# Patient Record
Sex: Male | Born: 1960 | Race: Black or African American | Hispanic: No | Marital: Single | Smoking: Never smoker
Health system: Southern US, Community
[De-identification: ages and names within clinical notes are randomized; demographics above are authoritative.]

## PROBLEM LIST (undated history)

## (undated) DIAGNOSIS — F1010 Alcohol abuse, uncomplicated: Secondary | ICD-10-CM

## (undated) DIAGNOSIS — F32A Depression, unspecified: Secondary | ICD-10-CM

## (undated) DIAGNOSIS — E119 Type 2 diabetes mellitus without complications: Secondary | ICD-10-CM

## (undated) DIAGNOSIS — J45909 Unspecified asthma, uncomplicated: Secondary | ICD-10-CM

## (undated) DIAGNOSIS — F329 Major depressive disorder, single episode, unspecified: Secondary | ICD-10-CM

## (undated) HISTORY — PX: HAND SURGERY: SHX662

## (undated) HISTORY — PX: KNEE SURGERY: SHX244

---

## 1997-09-07 ENCOUNTER — Emergency Department (HOSPITAL_COMMUNITY): Admission: EM | Admit: 1997-09-07 | Discharge: 1997-09-07 | Payer: Self-pay | Admitting: Emergency Medicine

## 1999-08-23 ENCOUNTER — Emergency Department (HOSPITAL_COMMUNITY): Admission: EM | Admit: 1999-08-23 | Discharge: 1999-08-23 | Payer: Self-pay | Admitting: Emergency Medicine

## 1999-08-23 ENCOUNTER — Encounter: Payer: Self-pay | Admitting: Emergency Medicine

## 1999-08-31 ENCOUNTER — Ambulatory Visit (HOSPITAL_BASED_OUTPATIENT_CLINIC_OR_DEPARTMENT_OTHER): Admission: RE | Admit: 1999-08-31 | Discharge: 1999-09-01 | Payer: Self-pay | Admitting: Orthopedic Surgery

## 2001-01-18 ENCOUNTER — Emergency Department (HOSPITAL_COMMUNITY): Admission: EM | Admit: 2001-01-18 | Discharge: 2001-01-18 | Payer: Self-pay | Admitting: Emergency Medicine

## 2002-02-23 ENCOUNTER — Emergency Department (HOSPITAL_COMMUNITY): Admission: EM | Admit: 2002-02-23 | Discharge: 2002-02-23 | Payer: Self-pay | Admitting: Emergency Medicine

## 2012-05-12 ENCOUNTER — Encounter (HOSPITAL_COMMUNITY): Payer: Self-pay

## 2012-05-12 ENCOUNTER — Emergency Department (HOSPITAL_COMMUNITY)
Admission: EM | Admit: 2012-05-12 | Discharge: 2012-05-12 | Disposition: A | Discharge status: Home / Self Care | Payer: Self-pay | Attending: Emergency Medicine | Admitting: Emergency Medicine

## 2012-05-12 DIAGNOSIS — T148XXA Other injury of unspecified body region, initial encounter: Secondary | ICD-10-CM

## 2012-05-12 DIAGNOSIS — IMO0002 Reserved for concepts with insufficient information to code with codable children: Secondary | ICD-10-CM | POA: Insufficient documentation

## 2012-05-12 DIAGNOSIS — Z23 Encounter for immunization: Secondary | ICD-10-CM | POA: Insufficient documentation

## 2012-05-12 DIAGNOSIS — Z9889 Other specified postprocedural states: Secondary | ICD-10-CM | POA: Insufficient documentation

## 2012-05-12 DIAGNOSIS — S51809A Unspecified open wound of unspecified forearm, initial encounter: Secondary | ICD-10-CM | POA: Insufficient documentation

## 2012-05-12 DIAGNOSIS — S41109A Unspecified open wound of unspecified upper arm, initial encounter: Secondary | ICD-10-CM | POA: Insufficient documentation

## 2012-05-12 MED ORDER — LIDOCAINE-EPINEPHRINE 2 %-1:100000 IJ SOLN: 20.0000 mL | Freq: Once | INTRAMUSCULAR | Status: DC

## 2012-05-12 MED ORDER — TETANUS-DIPHTH-ACELL PERTUSSIS 5-2.5-18.5 LF-MCG/0.5 IM SUSP
0.5000 mL | Freq: Once | INTRAMUSCULAR | Status: AC
  Administered 2012-05-12: 0.5 mL via INTRAMUSCULAR
  Filled 2012-05-12: qty 0.5
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
## 2012-05-12 NOTE — ED Provider Notes (Signed)
Medical screening examination/treatment/procedure(s) were performed by non-physician practitioner and as supervising physician I was immediately available for consultation/collaboration.  John-Adam Nolberto Cheuvront, M.D.  John-Adam Bryar Rennie, MD 05/12/12 1146 
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
## 2012-05-28 ENCOUNTER — Emergency Department (HOSPITAL_BASED_OUTPATIENT_CLINIC_OR_DEPARTMENT_OTHER): Payer: Medicaid Other

## 2012-05-28 ENCOUNTER — Emergency Department (HOSPITAL_COMMUNITY): Payer: Medicaid Other

## 2012-05-28 ENCOUNTER — Inpatient Hospital Stay (HOSPITAL_BASED_OUTPATIENT_CLINIC_OR_DEPARTMENT_OTHER)
Admission: EM | Admit: 2012-05-28 | Discharge: 2012-05-30 | DRG: 087 | Disposition: A | Discharge status: Home / Self Care | Payer: Medicaid Other | Attending: General Surgery | Admitting: General Surgery

## 2012-05-28 ENCOUNTER — Encounter (HOSPITAL_BASED_OUTPATIENT_CLINIC_OR_DEPARTMENT_OTHER): Payer: Self-pay | Admitting: *Deleted

## 2012-05-28 DIAGNOSIS — S0003XA Contusion of scalp, initial encounter: Secondary | ICD-10-CM | POA: Diagnosis present

## 2012-05-28 DIAGNOSIS — R5381 Other malaise: Secondary | ICD-10-CM | POA: Diagnosis present

## 2012-05-28 DIAGNOSIS — S161XXA Strain of muscle, fascia and tendon at neck level, initial encounter: Secondary | ICD-10-CM

## 2012-05-28 DIAGNOSIS — S06369A Traumatic hemorrhage of cerebrum, unspecified, with loss of consciousness of unspecified duration, initial encounter: Secondary | ICD-10-CM

## 2012-05-28 DIAGNOSIS — S20212A Contusion of left front wall of thorax, initial encounter: Secondary | ICD-10-CM

## 2012-05-28 DIAGNOSIS — S0181XA Laceration without foreign body of other part of head, initial encounter: Secondary | ICD-10-CM

## 2012-05-28 DIAGNOSIS — S0633AA Contusion and laceration of cerebrum, unspecified, with loss of consciousness status unknown, initial encounter: Secondary | ICD-10-CM

## 2012-05-28 DIAGNOSIS — S1093XA Contusion of unspecified part of neck, initial encounter: Secondary | ICD-10-CM | POA: Diagnosis present

## 2012-05-28 DIAGNOSIS — S065XAA Traumatic subdural hemorrhage with loss of consciousness status unknown, initial encounter: Secondary | ICD-10-CM

## 2012-05-28 DIAGNOSIS — S80819A Abrasion, unspecified lower leg, initial encounter: Secondary | ICD-10-CM

## 2012-05-28 DIAGNOSIS — IMO0002 Reserved for concepts with insufficient information to code with codable children: Secondary | ICD-10-CM | POA: Diagnosis present

## 2012-05-28 DIAGNOSIS — S065X9A Traumatic subdural hemorrhage with loss of consciousness of unspecified duration, initial encounter: Secondary | ICD-10-CM

## 2012-05-28 DIAGNOSIS — S0083XA Contusion of other part of head, initial encounter: Secondary | ICD-10-CM

## 2012-05-28 DIAGNOSIS — S01112A Laceration without foreign body of left eyelid and periocular area, initial encounter: Secondary | ICD-10-CM

## 2012-05-28 DIAGNOSIS — S065X0A Traumatic subdural hemorrhage without loss of consciousness, initial encounter: Principal | ICD-10-CM | POA: Diagnosis present

## 2012-05-28 DIAGNOSIS — S0180XA Unspecified open wound of other part of head, initial encounter: Secondary | ICD-10-CM | POA: Diagnosis present

## 2012-05-28 DIAGNOSIS — R413 Other amnesia: Secondary | ICD-10-CM | POA: Diagnosis present

## 2012-05-28 DIAGNOSIS — I619 Nontraumatic intracerebral hemorrhage, unspecified: Secondary | ICD-10-CM

## 2012-05-28 DIAGNOSIS — S139XXA Sprain of joints and ligaments of unspecified parts of neck, initial encounter: Secondary | ICD-10-CM

## 2012-05-28 LAB — CBC WITH DIFFERENTIAL/PLATELET
Basophils Absolute: 0 10*3/uL (ref 0.0–0.1)
Basophils Relative: 0 % (ref 0–1)
Eosinophils Absolute: 0 10*3/uL (ref 0.0–0.7)
Eosinophils Relative: 0 % (ref 0–5)
MCH: 32.2 pg (ref 26.0–34.0)
MCHC: 35.3 g/dL (ref 30.0–36.0)
MCV: 91.4 fL (ref 78.0–100.0)
Platelets: 273 10*3/uL (ref 150–400)
RDW: 12.5 % (ref 11.5–15.5)
WBC: 15.1 10*3/uL — ABNORMAL HIGH (ref 4.0–10.5)

## 2012-05-28 LAB — COMPREHENSIVE METABOLIC PANEL
ALT: 38 U/L (ref 0–53)
AST: 89 U/L — ABNORMAL HIGH (ref 0–37)
Calcium: 9.3 mg/dL (ref 8.4–10.5)
Creatinine, Ser: 1.1 mg/dL (ref 0.50–1.35)
Sodium: 144 mEq/L (ref 135–145)
Total Protein: 7.6 g/dL (ref 6.0–8.3)

## 2012-05-28 MED ORDER — MORPHINE SULFATE 2 MG/ML IJ SOLN
2.0000 mg | INTRAMUSCULAR | Status: DC | PRN
  Administered 2012-05-28 – 2012-05-29 (×3): 2 mg via INTRAVENOUS
  Filled 2012-05-28 (×3): qty 1

## 2012-05-28 MED ORDER — PANTOPRAZOLE SODIUM 40 MG PO TBEC
40.0000 mg | DELAYED_RELEASE_TABLET | Freq: Every day | ORAL | Status: DC
  Administered 2012-05-28 – 2012-05-30 (×3): 40 mg via ORAL
  Filled 2012-05-28 (×3): qty 1

## 2012-05-28 MED ORDER — ONDANSETRON HCL 4 MG PO TABS: 4.0000 mg | ORAL_TABLET | Freq: Four times a day (QID) | ORAL | Status: DC | PRN

## 2012-05-28 MED ORDER — DOCUSATE SODIUM 100 MG PO CAPS
100.0000 mg | ORAL_CAPSULE | Freq: Two times a day (BID) | ORAL | Status: DC
  Administered 2012-05-28 – 2012-05-30 (×5): 100 mg via ORAL
  Filled 2012-05-28 (×6): qty 1

## 2012-05-28 MED ORDER — ACETAMINOPHEN 325 MG PO TABS: 650.0000 mg | ORAL_TABLET | ORAL | Status: DC | PRN

## 2012-05-28 MED ORDER — POTASSIUM CHLORIDE IN NACL 20-0.9 MEQ/L-% IV SOLN
INTRAVENOUS | Status: DC
  Administered 2012-05-28 (×2): via INTRAVENOUS
  Filled 2012-05-28 (×4): qty 1000

## 2012-05-28 MED ORDER — PANTOPRAZOLE SODIUM 40 MG IV SOLR
40.0000 mg | Freq: Every day | INTRAVENOUS | Status: DC
  Filled 2012-05-28: qty 40

## 2012-05-28 MED ORDER — OXYCODONE HCL 5 MG PO TABS
5.0000 mg | ORAL_TABLET | ORAL | Status: DC | PRN
  Administered 2012-05-28 – 2012-05-29 (×3): 5 mg via ORAL
  Filled 2012-05-28 (×3): qty 1

## 2012-05-28 MED ORDER — OXYCODONE HCL 5 MG PO TABS
10.0000 mg | ORAL_TABLET | ORAL | Status: DC | PRN
  Administered 2012-05-28 – 2012-05-30 (×4): 10 mg via ORAL
  Filled 2012-05-28 (×4): qty 2

## 2012-05-28 MED ORDER — LORAZEPAM 2 MG/ML IJ SOLN
INTRAMUSCULAR | Status: AC
  Administered 2012-05-28: 2 mg
  Filled 2012-05-28: qty 1

## 2012-05-28 MED ORDER — LORAZEPAM 2 MG/ML IJ SOLN: 1.0000 mg | Freq: Four times a day (QID) | INTRAMUSCULAR | Status: DC | PRN

## 2012-05-28 MED ORDER — ONDANSETRON HCL 4 MG/2ML IJ SOLN: 4.0000 mg | Freq: Four times a day (QID) | INTRAMUSCULAR | Status: DC | PRN

## 2012-05-28 NOTE — ED Notes (Signed)
Wife at the bedside

## 2012-05-28 NOTE — ED Provider Notes (Signed)
Pt arrived from [facility] He is awake/alert Dr Janee Morn is here to evaluate patient for admission   Joya Gaskins, MD 05/28/12 [phone]

## 2012-05-28 NOTE — Progress Notes (Signed)
Utilization review completed.  P.J. Audelia Knape,RN,BSN Case Manager [phone]  

## 2012-05-28 NOTE — ED Notes (Addendum)
Pt pulled IV out and stating he is going to leave. Dr. Janee Morn contacted and informed. States he will be down here to see the patient as soon as he can but the patient does not need to leave. [employer] called to the bedside. Pt states he doesn't want to stay and has to much going on at work.
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
## 2012-05-28 NOTE — ED Notes (Signed)
Pt agreed to change into paper scrubs and footies. Pt is aware that he is being transferred to [facility] ER and agrees to transport. Pt signed transfer consent and called his wife to tell her where he is being transferred to.

## 2012-05-28 NOTE — ED Notes (Signed)
Pt continues to state that he is not going to go up to his room. Pt continues to try and call out on the phone but unable to reach family member. [employer] and

## 2012-05-28 NOTE — ED Notes (Signed)
Admitting at the bedside.  
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
## 2012-05-28 NOTE — ED Notes (Signed)
Pt returned from CT °

## 2012-05-28 NOTE — ED Notes (Signed)
Pt now agreeing to be sutured and to have radiological studies done.

## 2012-05-28 NOTE — ED Notes (Signed)
Dr. Lerry Liner at bedside attempting to repair eyebrow lac but pt sts that he does not want to be treated and he wants to leave. Pt given telephone and is calling for a ride at this time.

## 2012-05-28 NOTE — ED Notes (Signed)
Pt has odor of ETOH about his person denies drinking tonight.

## 2012-05-28 NOTE — ED Notes (Signed)
I spoke with pt's uncle, Nabil Bubolz, who sts he is on his way to pick up the pt and will be here in apprx. 1 hour.

## 2012-05-28 NOTE — ED Notes (Signed)
[facility] unavailable for pt transport, call placed to [facility] for transport to [facility] per Dr Judd Lien.
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
## 2012-05-28 NOTE — ED Notes (Signed)
MD at bedside. 

## 2012-05-28 NOTE — Progress Notes (Signed)
Patient ID: [name], male   DOB: [birth year], 52 y.o.   MRN: [id number] Called by RN.  Patient took out his IV and wants to leave.  He is agitated.  I explained again his TBI/SDH/intra cerebral hemorrhage and the importance of him staying for NS evaluation and treatment.  He is verbally abusive but seems to agree to at least have his IV replaced and go to 3100.  Dr. Phoebe Perch will see him there.  Will give ativan 1mg  X1  Once IV in. [employer] also at bedside. Violeta Gelinas, MD, MPH, FACS Pager: [phone]
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
## 2012-05-28 NOTE — ED Notes (Signed)
Pt is now questioning where his shoes and phone are. Pt was dropped off by [employer] with no belongings.

## 2012-05-28 NOTE — ED Notes (Signed)
Pt's wife Maxine Glenn called and sts she is on her way to [facility] ER at this time.

## 2012-05-28 NOTE — ED Notes (Signed)
Patient transported to CT 
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
## 2012-05-29 ENCOUNTER — Inpatient Hospital Stay (HOSPITAL_COMMUNITY): Payer: Medicaid Other

## 2012-05-29 ENCOUNTER — Encounter (HOSPITAL_COMMUNITY): Payer: Self-pay | Admitting: Radiology

## 2012-05-29 LAB — BASIC METABOLIC PANEL
BUN: 9 mg/dL (ref 6–23)
Creatinine, Ser: 1.01 mg/dL (ref 0.50–1.35)
GFR calc Af Amer: >90 mL/min (ref >90)
GFR calc non Af Amer: 85 mL/min — ABNORMAL LOW (ref >90)
Glucose, Bld: 107 mg/dL — ABNORMAL HIGH (ref 70–99)

## 2012-05-29 LAB — CBC
HCT: 40.7 % (ref 39.0–52.0)
MCH: 32.1 pg (ref 26.0–34.0)
MCHC: 35.4 g/dL (ref 30.0–36.0)
MCV: 90.6 fL (ref 78.0–100.0)
RDW: 12.7 % (ref 11.5–15.5)
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
## 2012-05-30 MED ORDER — OXYCODONE-ACETAMINOPHEN 5-325 MG PO TABS: 1.0000 | ORAL_TABLET | ORAL | Status: DC | PRN

## 2012-05-30 NOTE — Discharge Summary (Signed)
Victoria Euceda, MD, MPH, FACS Pager: [phone]  

## 2012-05-30 NOTE — Progress Notes (Signed)
Doing well. C/o mild HA  Temp:  [97.9 F (36.6 C)-98.7 F (37.1 C)] 98.1 F (36.7 C) (04/17 0400) Pulse Rate:  [71-92] 82 (04/17 0400) Resp:  [13-26] 18 (04/16 2130) BP: (118-162)/(75-106) 118/75 mmHg (04/17 0400) SpO2:  [98 %-100 %] 98 % (04/17 0400) AAOx3  - FC all 4  - was up ambulating yesterday   Plan: Working with TX's  - CPM Recall if we can be of further assistance
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
## 2012-05-30 NOTE — Clinical Social Work Note (Signed)
Clinical Social Worker provided patient with requested court letter regarding court date of today.  Patient verbalized appreciation and understanding.  Clinical Social Worker will sign off for now as social work intervention is no longer needed. Please consult [place] again if new need arises.  Macario Golds, [place] [phone]
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
## 2012-05-30 NOTE — Progress Notes (Signed)
DC INSTRUCTIONS GIVEN TO PT AT THIS TIME RE: F/U APPTS, S/S OF PROBLEMS TO REPORT TO MD, NEW PRESCRIPTIONS, PAIN CONTROL, MYCHART, AND COMMUNITY RESOURCES.  PT VERBALIZED UNDERSTANDING OF ALL INSTRUCTIONS.  NO S/S OF ANY ACUTE DISTRESS NOTED AT THIS TIME.

## 2012-05-30 NOTE — Progress Notes (Addendum)
Speech Language Pathology Treatment Patient Details Name: [name] MRN: [id number] DOB: 01/26/1961 Today's Date: 05/30/2012 Time: 4696-2952 SLP Time Calculation (min): 40 min  Assessment / Plan / Recommendation Clinical Impression  Treatment focused on facilitation of cognitive recovery and differential diagnosis of current cognitive status. MD questioning if patient safe to return home without supervision. Cognistat provided. Patient presents with mild short term memory deficits and perseveration on recent events, particularly his lack of knowledge of pre-admission events. Patient presented with positive intellectual awareness of deficits today and general orientation to situation. This SLP is unclear at this time, what information patient has been provided with regarding current situation, making diagnosis of short term memory deficits difficult. Based on evaluation results, recommend at least intermittent supervision after d/c home, particularly for high risk ADLs (cooking, medication administration, etc). Education provided with patient regarding use of compensatory strategies to facilitate short term recall and safety with ADLs (repetition if information, writing, etc). Patient verbalized understanding. SLP will continue to f/u while inpatient. Recommend OP SLP services after d/c as well as potential neuro psych consult.     SLP Plan  Continue with current plan of care    Pertinent Vitals/Pain None reported  SLP Goals  SLP Goals Potential to Achieve Goals: Good SLP Goal #1: Pt will demonstrate alternating/divided attention during functional tasks with supervision cues.  SLP Goal #1 - Progress: Progressing toward goal SLP Goal #2: Pt will demosntrate complex problem solving with supervision cues.  SLP Goal #2 - Progress: Progressing toward goal SLP Goal #3: Pt will verbalize recent events x3 with supervision cues.  SLP Goal #3 - Progress: Progressing toward goal         Treatment Treatment focused on: Cognition Skilled Treatment: Treatment focused on facilitation of cognitive recovery and differential diagnosis of current cognitive status. MD questioning if patient safe to return home without supervision. Cognistat administered. Patient presents with mild attention and  mild short term memory deficits and perseveration on recent events, particularly his lack of knowledge of pre-admission events. Patient presented with positive intellectual awareness of deficits today and general orientation to situation. This SLP is unclear at this time, what information patient has been provided with regarding current situation, making diagnosis of short term memory deficits difficult. Based on evaluation results, recommend at least intermittent supervision after d/c home, particularly for high risk ADLs (cooking, medication administration, etc). Education provided with patient regarding use of compensatory strategies to facilitate short term recall and safety with ADLs (repetition if information, writing, etc). Patient verbalized understanding. SLP will continue to f/u while inpatient. Recommend OP SLP services after d/c as well as potential neuro psych consult.    GO   Ferdinand Lango MA, CCC-SLP [phone]   [name] 05/30/2012, 10:08 AM

## 2012-06-04 ENCOUNTER — Encounter (INDEPENDENT_AMBULATORY_CARE_PROVIDER_SITE_OTHER): Payer: Self-pay

## 2012-06-07 ENCOUNTER — Emergency Department (HOSPITAL_COMMUNITY)
Admission: EM | Admit: 2012-06-07 | Discharge: 2012-06-07 | Discharge status: Against Medical Advice | Payer: Medicaid Other | Attending: Emergency Medicine | Admitting: Emergency Medicine

## 2012-06-07 DIAGNOSIS — IMO0002 Reserved for concepts with insufficient information to code with codable children: Secondary | ICD-10-CM

## 2012-06-07 DIAGNOSIS — F1010 Alcohol abuse, uncomplicated: Secondary | ICD-10-CM | POA: Insufficient documentation

## 2012-06-07 NOTE — ED Notes (Signed)
ZOX:[id number]<AV> Expected date:<BR> Expected time:<BR> Means of arrival:<BR> Comments:<BR> EMS
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
## 2012-06-13 ENCOUNTER — Telehealth (HOSPITAL_COMMUNITY): Payer: Self-pay | Admitting: Emergency Medicine

## 2012-06-14 NOTE — Telephone Encounter (Signed)
Phone # given does not accept incoming calls.

## 2012-06-20 ENCOUNTER — Telehealth (HOSPITAL_COMMUNITY): Payer: Self-pay | Admitting: Emergency Medicine

## 2012-06-21 NOTE — Telephone Encounter (Signed)
Phone still doesn't accept incoming calls.

## 2012-07-17 ENCOUNTER — Emergency Department (HOSPITAL_COMMUNITY): Payer: Medicaid Other

## 2012-07-17 ENCOUNTER — Emergency Department (HOSPITAL_COMMUNITY)
Admission: EM | Admit: 2012-07-17 | Discharge: 2012-07-21 | Disposition: A | Discharge status: Other Acute Inpatient Hospital | Payer: Medicaid Other | Attending: Emergency Medicine | Admitting: Emergency Medicine

## 2012-07-17 ENCOUNTER — Encounter (HOSPITAL_COMMUNITY): Payer: Self-pay | Admitting: *Deleted

## 2012-07-17 DIAGNOSIS — R45851 Suicidal ideations: Secondary | ICD-10-CM

## 2012-07-17 DIAGNOSIS — R45850 Homicidal ideations: Secondary | ICD-10-CM | POA: Insufficient documentation

## 2012-07-17 HISTORY — DX: Alcohol abuse, uncomplicated: F10.10

## 2012-07-17 LAB — COMPREHENSIVE METABOLIC PANEL
ALT: 29 U/L (ref 0–53)
Albumin: 4.2 g/dL (ref 3.5–5.2)
Alkaline Phosphatase: 60 U/L (ref 39–117)
BUN: 12 mg/dL (ref 6–23)
Calcium: 9.6 mg/dL (ref 8.4–10.5)
GFR calc Af Amer: >90 mL/min (ref >90)
Potassium: 3.7 mEq/L (ref 3.5–5.1)
Sodium: 137 mEq/L (ref 135–145)
Total Protein: 7.8 g/dL (ref 6.0–8.3)

## 2012-07-17 LAB — URINALYSIS, ROUTINE W REFLEX MICROSCOPIC
Bilirubin Urine: NEGATIVE
Ketones, ur: NEGATIVE mg/dL
Specific Gravity, Urine: 1.03 (ref 1.005–1.030)
Urobilinogen, UA: 1 mg/dL (ref 0.0–1.0)

## 2012-07-17 LAB — CBC
MCH: 32.7 pg (ref 26.0–34.0)
MCHC: 35.9 g/dL (ref 30.0–36.0)
RDW: 13.1 % (ref 11.5–15.5)

## 2012-07-17 LAB — SALICYLATE LEVEL: Salicylate Lvl: <2 mg/dL — ABNORMAL LOW (ref 2.8–20.0)

## 2012-07-17 LAB — ETHANOL: Alcohol, Ethyl (B): 63 mg/dL — ABNORMAL HIGH (ref 0–11)

## 2012-07-17 LAB — RAPID URINE DRUG SCREEN, HOSP PERFORMED
Cocaine: NOT DETECTED
Opiates: NOT DETECTED
Tetrahydrocannabinol: POSITIVE — AB

## 2012-07-17 LAB — URINE MICROSCOPIC-ADD ON

## 2012-07-17 LAB — GLUCOSE, CAPILLARY

## 2012-07-17 NOTE — ED Notes (Signed)
Pt's CBG 109
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
## 2012-07-17 NOTE — ED Notes (Signed)
House coverage aware of need for sitter should be able to provide at 23:00. Pt being undressed and placed in scrubs, along with blood work and urine in triage. Pt sister remains with patient.
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
## 2012-07-18 ENCOUNTER — Emergency Department (HOSPITAL_COMMUNITY): Payer: Medicaid Other

## 2012-07-18 DIAGNOSIS — R45851 Suicidal ideations: Secondary | ICD-10-CM

## 2012-07-18 MED ORDER — ACETAMINOPHEN 325 MG PO TABS: 650.0000 mg | ORAL_TABLET | ORAL | Status: DC | PRN

## 2012-07-18 MED ORDER — ZOLPIDEM TARTRATE 5 MG PO TABS
5.0000 mg | ORAL_TABLET | Freq: Every evening | ORAL | Status: DC | PRN
  Administered 2012-07-18 – 2012-07-19 (×3): 5 mg via ORAL
  Filled 2012-07-18 (×3): qty 1

## 2012-07-18 MED ORDER — ALUM & MAG HYDROXIDE-SIMETH 200-200-20 MG/5ML PO SUSP: 30.0000 mL | ORAL | Status: DC | PRN

## 2012-07-18 MED ORDER — ONDANSETRON HCL 4 MG PO TABS: 4.0000 mg | ORAL_TABLET | Freq: Three times a day (TID) | ORAL | Status: DC | PRN

## 2012-07-18 MED ORDER — IBUPROFEN 200 MG PO TABS
600.0000 mg | ORAL_TABLET | Freq: Three times a day (TID) | ORAL | Status: DC | PRN
  Administered 2012-07-18 – 2012-07-21 (×7): 600 mg via ORAL
  Filled 2012-07-18 (×7): qty 1

## 2012-07-18 MED ORDER — MIRTAZAPINE 15 MG PO TABS
15.0000 mg | ORAL_TABLET | Freq: Every day | ORAL | Status: DC
  Administered 2012-07-18 – 2012-07-20 (×3): 15 mg via ORAL
  Filled 2012-07-18 (×5): qty 1

## 2012-07-18 MED ORDER — LORAZEPAM 1 MG PO TABS
1.0000 mg | ORAL_TABLET | Freq: Three times a day (TID) | ORAL | Status: DC | PRN
  Administered 2012-07-18 – 2012-07-20 (×5): 1 mg via ORAL
  Filled 2012-07-18 (×6): qty 1

## 2012-07-18 MED ORDER — LORAZEPAM 1 MG PO TABS
1.0000 mg | ORAL_TABLET | Freq: Once | ORAL | Status: AC
Start: 2012-07-18 — End: 2012-07-18
  Administered 2012-07-18: 1 mg via ORAL
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
## 2012-07-18 NOTE — ED Notes (Signed)
TELEPSYCH IN PROGRESS 

## 2012-07-18 NOTE — Procedures (Addendum)
EEG report.  Brief clinical history: unavailable. Technique: this is a 17 channel routine scalp EEG performed at the bedside with bipolar and monopolar montages arranged in accordance to the international 10/20 system of electrode placement. One channel was dedicated to EKG recording.  The study was performed during wakefulness, drowsiness, and stage 2 sleep. Hyperventilation and intermittent photic stimulation were utilized as activating procedures.  Description:In the wakeful state, the best background consisted of a medium amplitude, posterior dominant, well sustained, symmetric and reactive 11 Hz rhythm. Drowsiness demonstrated dropout of the alpha rhythm. Stage 2 sleep showed symmetric and synchronous sleep spindles without intermixed epileptiform discharges. Hyperventilation induced mild, diffuse, physiologic slowing but no epileptiform discharges. Intermittent photic stimulation did induce a normal driving response.  No focal or generalized epileptiform discharges noted.  No pathologic areas of slowing seen.  EKG showed sinus rhythm.  Impression: this is a normal awake and asleep EEG. Please, be aware that a normal EEG does not exclude the possibility of epilepsy.  Clinical correlation is advised.  Wyatt Portela, MD

## 2012-07-18 NOTE — ED Provider Notes (Signed)
Recheck, pt alert, content, nad.  telepsych complete, w psychiatrist recommending eeg, and remeron 15 mg qhs - ordered. Discussed w act team, dispo remains pending.   Suzi Roots, MD 07/18/12 1120

## 2012-07-18 NOTE — ED Notes (Signed)
Results to md. He will order meds and testing recommended by telepsych doctor

## 2012-07-18 NOTE — ED Provider Notes (Signed)
Pt alert, content, eating breakfast. Act eval, telepsych pending.    Suzi Roots, MD 07/18/12 [phone]

## 2012-07-18 NOTE — ED Notes (Signed)
Patient has been on and off phone with is wife. Wife can be heard yelling at patient but he speaks in a calm controlled tone to her. When pt hangs up she calls back. Patient is given the message. Encouraged patient to rest and call her back on his terms. Pt aware eeg will be coming to see him soon
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
## 2012-07-18 NOTE — ED Notes (Signed)
PATIENT HAS BEEN ON PHONE WITH WIFE . NOW QUESTIONING WHEN HE CAN BE DISCHARGED HOME. WILL REFER PT TO ACT TEAM . HE DENIES SI/HI NOW.  SITTER AT BEDSIDE

## 2012-07-18 NOTE — ED Notes (Signed)
Called eeg to set up pt study. Stanton Kidney [phone]

## 2012-07-18 NOTE — Progress Notes (Signed)
EEG completed.

## 2012-07-18 NOTE — ED Notes (Signed)
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 States he has a felony on his record so he cannot get a good job. States he has pending charges in the court system. He cannot sleep. He cant remember things. States he is just very tired and doesn't want to live anymore. States he wants to hurt people but he knows he would kill himself before he would hurt someone else.

## 2012-07-18 NOTE — ED Notes (Signed)
gpd here to serve pt ivc papers

## 2012-07-18 NOTE — ED Notes (Signed)
Patient at desk,.explained to patient that he has been committed. He is concerned about his bills and having a bench warrant issued due to his failure to appear.

## 2012-07-19 NOTE — ED Provider Notes (Signed)
BP 141/89  Pulse 60  Temp(Src) 97.8 F (36.6 C) (Oral)  Resp 18  SpO2 97% No acute issues overnight. Pt resting comfortably.   Loren Racer, MD 07/19/12 [phone]
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
## 2012-07-20 NOTE — BH Assessment (Signed)
[facility] Assessment Progress Note   Patient has been accepted to [facility].  Patient is on IVC so will be transported after 07:00 on 06/08 by [facility] Dept.  Nurse is to call for transportation from [place] around 05:30, call [phone] to leave a message for transport.  Accepting psychiatrist at [facility] is Dr. Carroll Sage.  Nurse call report to [phone].

## 2012-07-20 NOTE — ED Notes (Signed)
Visualized on monitor, sitter at BS, resting/sleeping, calm, NAD. 

## 2012-07-20 NOTE — ED Notes (Signed)
Spoke with Tacey Ruiz at [facility] and pt was accepted.

## 2012-07-20 NOTE — ED Notes (Signed)
Patient is cooperative and calm. When on the phone pt observed to remain calm even though person on phone can be heard yelling at times. He states he cannot guarentee what will happen when/if he goes home. Stats you just dont know what "will come in my mind". Act continues to seek placement for patient. Patient continues to be compliant with medications

## 2012-07-21 ENCOUNTER — Encounter (HOSPITAL_COMMUNITY): Payer: Self-pay | Admitting: *Deleted

## 2012-07-21 NOTE — ED Notes (Signed)
Pt visualized on monitor, no changes, sleeping, NAD, calm, sitter at [facility].

## 2012-07-21 NOTE — Progress Notes (Addendum)
Writer provided the pt's wife with a google map with dirving instructions to [facility] in [place], [place].          Denice Bors, AADC 07/21/2012 4:39 PM

## 2012-07-21 NOTE — ED Notes (Signed)
[facility] called--will be approx 3pm before pick up for transport to [facility].

## 2012-07-21 NOTE — ED Notes (Addendum)
Pt visualized on monitor, no changes, sleeping, NAD, calm, sitter at BS.   

## 2012-07-27 NOTE — ED Provider Notes (Signed)
Medical screening examination/treatment/procedure(s) were conducted as a shared visit with non-physician practitioner(s) and myself.  I personally evaluated the patient during the encounter. Please see my previous note.  Laray Anger, DO 07/27/12 0102

## 2013-10-11 ENCOUNTER — Encounter (HOSPITAL_COMMUNITY): Payer: Self-pay | Admitting: Emergency Medicine

## 2013-10-11 ENCOUNTER — Inpatient Hospital Stay (HOSPITAL_COMMUNITY)
Admission: EM | Admit: 2013-10-11 | Discharge: 2013-10-14 | DRG: 638 | Disposition: A | Discharge status: Home / Self Care | Payer: Medicaid Other | Attending: Internal Medicine | Admitting: Internal Medicine

## 2013-10-11 ENCOUNTER — Emergency Department (HOSPITAL_COMMUNITY): Payer: Medicaid Other

## 2013-10-11 DIAGNOSIS — E871 Hypo-osmolality and hyponatremia: Secondary | ICD-10-CM | POA: Diagnosis present

## 2013-10-11 DIAGNOSIS — F3289 Other specified depressive episodes: Secondary | ICD-10-CM

## 2013-10-11 DIAGNOSIS — Z794 Long term (current) use of insulin: Secondary | ICD-10-CM | POA: Diagnosis not present

## 2013-10-11 DIAGNOSIS — R0789 Other chest pain: Secondary | ICD-10-CM | POA: Diagnosis present

## 2013-10-11 DIAGNOSIS — F1010 Alcohol abuse, uncomplicated: Secondary | ICD-10-CM | POA: Diagnosis present

## 2013-10-11 DIAGNOSIS — J45909 Unspecified asthma, uncomplicated: Secondary | ICD-10-CM | POA: Diagnosis present

## 2013-10-11 DIAGNOSIS — F329 Major depressive disorder, single episode, unspecified: Secondary | ICD-10-CM

## 2013-10-11 DIAGNOSIS — G609 Hereditary and idiopathic neuropathy, unspecified: Secondary | ICD-10-CM | POA: Diagnosis present

## 2013-10-11 DIAGNOSIS — R7309 Other abnormal glucose: Secondary | ICD-10-CM | POA: Diagnosis present

## 2013-10-11 DIAGNOSIS — K219 Gastro-esophageal reflux disease without esophagitis: Secondary | ICD-10-CM | POA: Diagnosis present

## 2013-10-11 DIAGNOSIS — R066 Hiccough: Secondary | ICD-10-CM | POA: Diagnosis present

## 2013-10-11 DIAGNOSIS — E8729 Other acidosis: Secondary | ICD-10-CM

## 2013-10-11 DIAGNOSIS — E876 Hypokalemia: Secondary | ICD-10-CM | POA: Diagnosis present

## 2013-10-11 DIAGNOSIS — E785 Hyperlipidemia, unspecified: Secondary | ICD-10-CM | POA: Diagnosis present

## 2013-10-11 DIAGNOSIS — E1110 Type 2 diabetes mellitus with ketoacidosis without coma: Secondary | ICD-10-CM | POA: Diagnosis present

## 2013-10-11 DIAGNOSIS — F32A Depression, unspecified: Secondary | ICD-10-CM

## 2013-10-11 DIAGNOSIS — E1310 Other specified diabetes mellitus with ketoacidosis without coma: Principal | ICD-10-CM | POA: Diagnosis present

## 2013-10-11 DIAGNOSIS — E872 Acidosis: Secondary | ICD-10-CM

## 2013-10-11 DIAGNOSIS — Z79899 Other long term (current) drug therapy: Secondary | ICD-10-CM

## 2013-10-11 DIAGNOSIS — R079 Chest pain, unspecified: Secondary | ICD-10-CM

## 2013-10-11 HISTORY — DX: Major depressive disorder, single episode, unspecified: F32.9

## 2013-10-11 HISTORY — DX: Type 2 diabetes mellitus without complications: E11.9

## 2013-10-11 HISTORY — DX: Depression, unspecified: F32.A

## 2013-10-11 HISTORY — DX: Unspecified asthma, uncomplicated: J45.909

## 2013-10-11 LAB — GLUCOSE, CAPILLARY
GLUCOSE-CAPILLARY: 235 mg/dL — AB (ref 70–99)
GLUCOSE-CAPILLARY: 185 mg/dL — AB (ref 70–99)
Glucose-Capillary: 343 mg/dL — ABNORMAL HIGH (ref 70–99)
Glucose-Capillary: 155 mg/dL — ABNORMAL HIGH (ref 70–99)
Glucose-Capillary: 147 mg/dL — ABNORMAL HIGH (ref 70–99)
Glucose-Capillary: 157 mg/dL — ABNORMAL HIGH (ref 70–99)

## 2013-10-11 LAB — CBC
HEMATOCRIT: 44.3 % (ref 39.0–52.0)
HEMATOCRIT: 41 % (ref 39.0–52.0)
HEMOGLOBIN: 14.9 g/dL (ref 13.0–17.0)
Hemoglobin: 16 g/dL (ref 13.0–17.0)
MCH: 30.2 pg (ref 26.0–34.0)
MCH: 30.1 pg (ref 26.0–34.0)
MCHC: 36.1 g/dL — ABNORMAL HIGH (ref 30.0–36.0)
MCHC: 36.3 g/dL — ABNORMAL HIGH (ref 30.0–36.0)
MCV: 83.6 fL (ref 78.0–100.0)
MCV: 82.8 fL (ref 78.0–100.0)
PLATELETS: 279 10*3/uL (ref 150–400)
PLATELETS: 248 10*3/uL (ref 150–400)
RBC: 5.3 MIL/uL (ref 4.22–5.81)
RBC: 4.95 MIL/uL (ref 4.22–5.81)
RDW: 13.1 % (ref 11.5–15.5)
RDW: 13.1 % (ref 11.5–15.5)
WBC: 9.9 10*3/uL (ref 4.0–10.5)
WBC: 9.3 10*3/uL (ref 4.0–10.5)

## 2013-10-11 LAB — URINALYSIS, ROUTINE W REFLEX MICROSCOPIC
Bilirubin Urine: NEGATIVE
Hgb urine dipstick: NEGATIVE
Ketones, ur: 40 mg/dL — AB
LEUKOCYTES UA: NEGATIVE
Nitrite: NEGATIVE
Protein, ur: NEGATIVE mg/dL
Specific Gravity, Urine: 1.031 — ABNORMAL HIGH (ref 1.005–1.030)
UROBILINOGEN UA: 0.2 mg/dL (ref 0.0–1.0)
pH: 5.5 (ref 5.0–8.0)

## 2013-10-11 LAB — COMPREHENSIVE METABOLIC PANEL
ALT: 24 U/L (ref 0–53)
ANION GAP: 26 — AB (ref 5–15)
AST: 18 U/L (ref 0–37)
Albumin: 3.9 g/dL (ref 3.5–5.2)
Alkaline Phosphatase: 97 U/L (ref 39–117)
BILIRUBIN TOTAL: 0.9 mg/dL (ref 0.3–1.2)
BUN: 26 mg/dL — AB (ref 6–23)
CALCIUM: 10.4 mg/dL (ref 8.4–10.5)
CHLORIDE: 82 meq/L — AB (ref 96–112)
CO2: 14 meq/L — AB (ref 19–32)
Creatinine, Ser: 1.1 mg/dL (ref 0.50–1.35)
GFR calc non Af Amer: 75 mL/min — ABNORMAL LOW (ref >90)
GFR, EST AFRICAN AMERICAN: 87 mL/min — AB (ref >90)
GLUCOSE: 623 mg/dL — AB (ref 70–99)
Potassium: 4.7 mEq/L (ref 3.7–5.3)
Sodium: 122 mEq/L — ABNORMAL LOW (ref 137–147)
Total Protein: 7.7 g/dL (ref 6.0–8.3)

## 2013-10-11 LAB — CBG MONITORING, ED
GLUCOSE-CAPILLARY: 511 mg/dL — AB (ref 70–99)
Glucose-Capillary: >600 mg/dL (ref 70–99)
Glucose-Capillary: 370 mg/dL — ABNORMAL HIGH (ref 70–99)

## 2013-10-11 LAB — MAGNESIUM: Magnesium: 2 mg/dL (ref 1.5–2.5)

## 2013-10-11 LAB — BASIC METABOLIC PANEL
Anion gap: 19 — ABNORMAL HIGH (ref 5–15)
BUN: 18 mg/dL (ref 6–23)
CO2: 16 mEq/L — ABNORMAL LOW (ref 19–32)
CREATININE: 0.81 mg/dL (ref 0.50–1.35)
Calcium: 9.5 mg/dL (ref 8.4–10.5)
Chloride: 95 mEq/L — ABNORMAL LOW (ref 96–112)
GFR calc Af Amer: >90 mL/min (ref >90)
Glucose, Bld: 175 mg/dL — ABNORMAL HIGH (ref 70–99)
Potassium: 3.7 mEq/L (ref 3.7–5.3)
SODIUM: 130 meq/L — AB (ref 137–147)

## 2013-10-11 LAB — CREATININE, SERUM
Creatinine, Ser: 0.77 mg/dL (ref 0.50–1.35)
GFR calc non Af Amer: >90 mL/min (ref >90)

## 2013-10-11 LAB — URINE MICROSCOPIC-ADD ON

## 2013-10-11 LAB — I-STAT CHEM 8, ED
BUN: 29 mg/dL — ABNORMAL HIGH (ref 6–23)
CALCIUM ION: 1.3 mmol/L — AB (ref 1.12–1.23)
Chloride: 94 mEq/L — ABNORMAL LOW (ref 96–112)
Creatinine, Ser: 1.1 mg/dL (ref 0.50–1.35)
GLUCOSE: 665 mg/dL — AB (ref 70–99)
HEMATOCRIT: 52 % (ref 39.0–52.0)
HEMOGLOBIN: 17.7 g/dL — AB (ref 13.0–17.0)
Potassium: 4.5 mEq/L (ref 3.7–5.3)
Sodium: 120 mEq/L — CL (ref 137–147)
TCO2: 17 mmol/L (ref 0–100)

## 2013-10-11 LAB — TROPONIN I: Troponin I: <0.3 ng/mL (ref <0.30)

## 2013-10-11 LAB — MRSA PCR SCREENING: MRSA BY PCR: POSITIVE — AB

## 2013-10-11 LAB — I-STAT CG4 LACTIC ACID, ED: LACTIC ACID, VENOUS: 1.58 mmol/L (ref 0.5–2.2)

## 2013-10-11 LAB — TSH: TSH: 1.01 u[IU]/mL (ref 0.350–4.500)

## 2013-10-11 LAB — PHOSPHORUS: Phosphorus: 1.9 mg/dL — ABNORMAL LOW (ref 2.3–4.6)

## 2013-10-11 MED ORDER — QUETIAPINE FUMARATE ER 400 MG PO TB24
400.0000 mg | ORAL_TABLET | Freq: Every evening | ORAL | Status: DC
  Administered 2013-10-11 – 2013-10-13 (×3): 400 mg via ORAL
  Filled 2013-10-11 (×4): qty 1

## 2013-10-11 MED ORDER — ALUM & MAG HYDROXIDE-SIMETH 200-200-20 MG/5ML PO SUSP: 30.0000 mL | Freq: Four times a day (QID) | ORAL | Status: DC | PRN

## 2013-10-11 MED ORDER — ONDANSETRON HCL 4 MG/2ML IJ SOLN
4.0000 mg | Freq: Four times a day (QID) | INTRAMUSCULAR | Status: DC | PRN
  Administered 2013-10-11: 4 mg via INTRAVENOUS
  Filled 2013-10-11: qty 2

## 2013-10-11 MED ORDER — SODIUM CHLORIDE 0.9 % IV SOLN
Freq: Once | INTRAVENOUS | Status: AC
  Administered 2013-10-11: 14:00:00 via INTRAVENOUS

## 2013-10-11 MED ORDER — GABAPENTIN 100 MG PO CAPS
100.0000 mg | ORAL_CAPSULE | Freq: Three times a day (TID) | ORAL | Status: DC
  Administered 2013-10-11 – 2013-10-14 (×9): 100 mg via ORAL
  Filled 2013-10-11 (×14): qty 1

## 2013-10-11 MED ORDER — ACETAMINOPHEN 325 MG PO TABS: 650.0000 mg | ORAL_TABLET | Freq: Four times a day (QID) | ORAL | Status: DC | PRN

## 2013-10-11 MED ORDER — MORPHINE SULFATE 2 MG/ML IJ SOLN: 2.0000 mg | INTRAMUSCULAR | Status: DC | PRN

## 2013-10-11 MED ORDER — ENOXAPARIN SODIUM 40 MG/0.4ML ~~LOC~~ SOLN
40.0000 mg | SUBCUTANEOUS | Status: DC
  Administered 2013-10-11 – 2013-10-13 (×3): 40 mg via SUBCUTANEOUS
  Filled 2013-10-11 (×4): qty 0.4

## 2013-10-11 MED ORDER — SODIUM CHLORIDE 0.9 % IV BOLUS (SEPSIS)
1000.0000 mL | Freq: Once | INTRAVENOUS | Status: AC
  Administered 2013-10-11: 1000 mL via INTRAVENOUS

## 2013-10-11 MED ORDER — SODIUM CHLORIDE 0.9 % IV SOLN: INTRAVENOUS | Status: DC

## 2013-10-11 MED ORDER — ALBUTEROL SULFATE HFA 108 (90 BASE) MCG/ACT IN AERS: 2.0000 | INHALATION_SPRAY | Freq: Four times a day (QID) | RESPIRATORY_TRACT | Status: DC | PRN

## 2013-10-11 MED ORDER — SERTRALINE HCL 50 MG PO TABS
50.0000 mg | ORAL_TABLET | Freq: Every day | ORAL | Status: DC
  Administered 2013-10-11 – 2013-10-13 (×3): 50 mg via ORAL
  Filled 2013-10-11 (×5): qty 1

## 2013-10-11 MED ORDER — DEXTROSE-NACL 5-0.45 % IV SOLN
INTRAVENOUS | Status: DC
  Administered 2013-10-11 – 2013-10-12 (×2): via INTRAVENOUS

## 2013-10-11 MED ORDER — SODIUM CHLORIDE 0.9 % IV SOLN
INTRAVENOUS | Status: DC
  Administered 2013-10-11: 125 mL/h via INTRAVENOUS

## 2013-10-11 MED ORDER — PNEUMOCOCCAL VAC POLYVALENT 25 MCG/0.5ML IJ INJ
0.5000 mL | INJECTION | INTRAMUSCULAR | Status: AC
Start: 2013-10-12 — End: 2013-10-12
  Administered 2013-10-12: 0.5 mL via INTRAMUSCULAR
  Filled 2013-10-11: qty 0.5

## 2013-10-11 MED ORDER — DOCUSATE SODIUM 100 MG PO CAPS
100.0000 mg | ORAL_CAPSULE | Freq: Two times a day (BID) | ORAL | Status: DC
  Administered 2013-10-11 – 2013-10-13 (×5): 100 mg via ORAL
  Filled 2013-10-11 (×5): qty 1

## 2013-10-11 MED ORDER — INSULIN REGULAR BOLUS VIA INFUSION
0.0000 [IU] | Freq: Three times a day (TID) | INTRAVENOUS | Status: DC
  Filled 2013-10-11: qty 10

## 2013-10-11 MED ORDER — ACETAMINOPHEN 650 MG RE SUPP: 650.0000 mg | Freq: Four times a day (QID) | RECTAL | Status: DC | PRN

## 2013-10-11 MED ORDER — ONDANSETRON HCL 4 MG PO TABS: 4.0000 mg | ORAL_TABLET | Freq: Four times a day (QID) | ORAL | Status: DC | PRN

## 2013-10-11 MED ORDER — SODIUM CHLORIDE 0.9 % IV SOLN
INTRAVENOUS | Status: DC
  Administered 2013-10-11: 4.5 [IU]/h via INTRAVENOUS
  Administered 2013-10-11: 5.7 [IU]/h via INTRAVENOUS
  Administered 2013-10-11: 3.5 [IU]/h via INTRAVENOUS
  Administered 2013-10-11: 3.8 [IU]/h via INTRAVENOUS
  Filled 2013-10-11: qty 2.5

## 2013-10-11 MED ORDER — PANTOPRAZOLE SODIUM 40 MG IV SOLR
40.0000 mg | INTRAVENOUS | Status: DC
  Administered 2013-10-11: 40 mg via INTRAVENOUS
  Filled 2013-10-11 (×2): qty 40

## 2013-10-11 MED ORDER — DEXTROSE 50 % IV SOLN: 25.0000 mL | INTRAVENOUS | Status: DC | PRN

## 2013-10-11 MED ORDER — ALBUTEROL SULFATE (2.5 MG/3ML) 0.083% IN NEBU: 2.5000 mg | INHALATION_SOLUTION | Freq: Four times a day (QID) | RESPIRATORY_TRACT | Status: DC | PRN

## 2013-10-11 NOTE — ED Notes (Signed)
[name] Panic blood sugar of 620.

## 2013-10-11 NOTE — ED Provider Notes (Signed)
Pt seen and evaluated.  D/W [facility]. [facility] [place].  Hyperglycemic. Polyuria polydipsia dry mouth hiccups. Is not acidotic and does not have elevated anion gap. Has a pseudohyponatremia and glucose of 665. Symptomatic with polyuria polydipsia polyphagia since being released from prison 4 months ago. He was on insulin while incarcerated. Has not been on it since. EKG shows no acute process. Troponin pending.  Rolland Porter, MD 10/11/13 1344
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
## 2013-10-11 NOTE — ED Notes (Signed)
Per [facility] pt's PCP discontinued his diabetic medication 2 months ago and wanted to try and control his diabetes with diet and exercise. Pt diagnosed as borderline diabetic 7 months ago. Pt reports elevated blood sugar, increased urination, hiccups, dizziness, and dry mouth x1 week. CBG 520 with [facility] and EMS received 487. VS BP 132/104, HR 100, RR 20
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
## 2013-10-12 DIAGNOSIS — E872 Acidosis, unspecified: Secondary | ICD-10-CM

## 2013-10-12 DIAGNOSIS — E871 Hypo-osmolality and hyponatremia: Secondary | ICD-10-CM

## 2013-10-12 DIAGNOSIS — E876 Hypokalemia: Secondary | ICD-10-CM

## 2013-10-12 LAB — LIPID PANEL
CHOL/HDL RATIO: 8.5 ratio
Cholesterol: 273 mg/dL — ABNORMAL HIGH (ref 0–200)
HDL: 32 mg/dL — ABNORMAL LOW (ref >39)
LDL Cholesterol: 188 mg/dL — ABNORMAL HIGH (ref 0–99)
Triglycerides: 263 mg/dL — ABNORMAL HIGH (ref <150)
VLDL: 53 mg/dL — ABNORMAL HIGH (ref 0–40)

## 2013-10-12 LAB — BASIC METABOLIC PANEL
ANION GAP: 14 (ref 5–15)
ANION GAP: 17 — AB (ref 5–15)
Anion gap: 14 (ref 5–15)
BUN: 14 mg/dL (ref 6–23)
BUN: 12 mg/dL (ref 6–23)
BUN: 11 mg/dL (ref 6–23)
CHLORIDE: 95 meq/L — AB (ref 96–112)
CHLORIDE: 96 meq/L (ref 96–112)
CO2: 20 mEq/L (ref 19–32)
CO2: 22 mEq/L (ref 19–32)
CO2: 23 mEq/L (ref 19–32)
Calcium: 8.8 mg/dL (ref 8.4–10.5)
Calcium: 9.6 mg/dL (ref 8.4–10.5)
Calcium: 9 mg/dL (ref 8.4–10.5)
Chloride: 95 mEq/L — ABNORMAL LOW (ref 96–112)
Creatinine, Ser: 0.84 mg/dL (ref 0.50–1.35)
Creatinine, Ser: 0.83 mg/dL (ref 0.50–1.35)
Creatinine, Ser: 0.84 mg/dL (ref 0.50–1.35)
GFR calc Af Amer: >90 mL/min (ref >90)
GFR calc non Af Amer: >90 mL/min (ref >90)
GFR calc non Af Amer: >90 mL/min (ref >90)
Glucose, Bld: 152 mg/dL — ABNORMAL HIGH (ref 70–99)
Glucose, Bld: 136 mg/dL — ABNORMAL HIGH (ref 70–99)
Glucose, Bld: 214 mg/dL — ABNORMAL HIGH (ref 70–99)
POTASSIUM: 2.9 meq/L — AB (ref 3.7–5.3)
Potassium: 3.7 mEq/L (ref 3.7–5.3)
Potassium: 3.7 mEq/L (ref 3.7–5.3)
SODIUM: 133 meq/L — AB (ref 137–147)
Sodium: 131 mEq/L — ABNORMAL LOW (ref 137–147)
Sodium: 132 mEq/L — ABNORMAL LOW (ref 137–147)

## 2013-10-12 LAB — GLUCOSE, CAPILLARY
GLUCOSE-CAPILLARY: 145 mg/dL — AB (ref 70–99)
GLUCOSE-CAPILLARY: 146 mg/dL — AB (ref 70–99)
GLUCOSE-CAPILLARY: 164 mg/dL — AB (ref 70–99)
GLUCOSE-CAPILLARY: 196 mg/dL — AB (ref 70–99)
GLUCOSE-CAPILLARY: 255 mg/dL — AB (ref 70–99)
GLUCOSE-CAPILLARY: 333 mg/dL — AB (ref 70–99)
GLUCOSE-CAPILLARY: 389 mg/dL — AB (ref 70–99)
Glucose-Capillary: 157 mg/dL — ABNORMAL HIGH (ref 70–99)
Glucose-Capillary: 124 mg/dL — ABNORMAL HIGH (ref 70–99)
Glucose-Capillary: 127 mg/dL — ABNORMAL HIGH (ref 70–99)
Glucose-Capillary: 165 mg/dL — ABNORMAL HIGH (ref 70–99)
Glucose-Capillary: 204 mg/dL — ABNORMAL HIGH (ref 70–99)
Glucose-Capillary: 217 mg/dL — ABNORMAL HIGH (ref 70–99)
Glucose-Capillary: 179 mg/dL — ABNORMAL HIGH (ref 70–99)
Glucose-Capillary: 275 mg/dL — ABNORMAL HIGH (ref 70–99)

## 2013-10-12 LAB — TROPONIN I: Troponin I: <0.3 ng/mL (ref <0.30)

## 2013-10-12 LAB — CBC
HCT: 38.5 % — ABNORMAL LOW (ref 39.0–52.0)
HEMOGLOBIN: 14 g/dL (ref 13.0–17.0)
MCH: 30.7 pg (ref 26.0–34.0)
MCHC: 36.4 g/dL — ABNORMAL HIGH (ref 30.0–36.0)
MCV: 84.4 fL (ref 78.0–100.0)
Platelets: 215 10*3/uL (ref 150–400)
RBC: 4.56 MIL/uL (ref 4.22–5.81)
RDW: 13.2 % (ref 11.5–15.5)
WBC: 7.7 10*3/uL (ref 4.0–10.5)

## 2013-10-12 LAB — HEMOGLOBIN A1C
Hgb A1c MFr Bld: 11.2 % — ABNORMAL HIGH (ref <5.7)
MEAN PLASMA GLUCOSE: 275 mg/dL — AB (ref <117)

## 2013-10-12 MED ORDER — SIMVASTATIN 20 MG PO TABS
20.0000 mg | ORAL_TABLET | Freq: Every day | ORAL | Status: DC
  Administered 2013-10-12: 20 mg via ORAL
  Filled 2013-10-12 (×4): qty 1

## 2013-10-12 MED ORDER — POTASSIUM CHLORIDE CRYS ER 20 MEQ PO TBCR
40.0000 meq | EXTENDED_RELEASE_TABLET | Freq: Once | ORAL | Status: AC
  Administered 2013-10-12: 40 meq via ORAL
  Filled 2013-10-12: qty 2

## 2013-10-12 MED ORDER — INSULIN ASPART 100 UNIT/ML ~~LOC~~ SOLN
0.0000 [IU] | Freq: Three times a day (TID) | SUBCUTANEOUS | Status: DC
  Administered 2013-10-13: 4 [IU] via SUBCUTANEOUS
  Administered 2013-10-13: 11 [IU] via SUBCUTANEOUS
  Administered 2013-10-13 – 2013-10-14 (×2): 15 [IU] via SUBCUTANEOUS
  Administered 2013-10-14: 11 [IU] via SUBCUTANEOUS

## 2013-10-12 MED ORDER — INSULIN ASPART 100 UNIT/ML ~~LOC~~ SOLN
5.0000 [IU] | Freq: Three times a day (TID) | SUBCUTANEOUS | Status: DC
  Administered 2013-10-12 – 2013-10-14 (×6): 5 [IU] via SUBCUTANEOUS

## 2013-10-12 MED ORDER — PANTOPRAZOLE SODIUM 40 MG PO TBEC
40.0000 mg | DELAYED_RELEASE_TABLET | Freq: Every day | ORAL | Status: DC
  Administered 2013-10-12 – 2013-10-14 (×3): 40 mg via ORAL
  Filled 2013-10-12 (×3): qty 1

## 2013-10-12 MED ORDER — KCL IN DEXTROSE-NACL 20-5-0.45 MEQ/L-%-% IV SOLN
INTRAVENOUS | Status: DC
  Administered 2013-10-12: 07:00:00 via INTRAVENOUS
  Filled 2013-10-12 (×3): qty 1000

## 2013-10-12 MED ORDER — POTASSIUM CHLORIDE 10 MEQ/100ML IV SOLN
10.0000 meq | INTRAVENOUS | Status: AC
  Administered 2013-10-12 (×2): 10 meq via INTRAVENOUS
  Filled 2013-10-12 (×2): qty 100

## 2013-10-12 MED ORDER — INSULIN ASPART 100 UNIT/ML ~~LOC~~ SOLN
0.0000 [IU] | Freq: Three times a day (TID) | SUBCUTANEOUS | Status: DC
  Administered 2013-10-12: 2 [IU] via SUBCUTANEOUS
  Administered 2013-10-12: 15 [IU] via SUBCUTANEOUS

## 2013-10-12 MED ORDER — INSULIN GLARGINE 100 UNIT/ML ~~LOC~~ SOLN
10.0000 [IU] | SUBCUTANEOUS | Status: DC
  Administered 2013-10-12: 10 [IU] via SUBCUTANEOUS
  Filled 2013-10-12 (×2): qty 0.1

## 2013-10-12 MED ORDER — INSULIN ASPART 100 UNIT/ML ~~LOC~~ SOLN
10.0000 [IU] | Freq: Once | SUBCUTANEOUS | Status: AC
  Administered 2013-10-12: 10 [IU] via SUBCUTANEOUS

## 2013-10-12 NOTE — Progress Notes (Signed)
[facility]                                                                              Patient Demographics  [name], is a 53 y.o. male, DOB - Jan 17, 1961, ZOX:[id number]  Admit date - 10/11/2013   Admitting Physician Edsel Petrin, DO  Outpatient Primary MD for the patient is Burtis Junes, MD  LOS - 1   Chief Complaint  Patient presents with  . Hyperglycemia      HPI on 10/11/2013 [name] is a 53 y.o. male with a history of diabetes mellitus, depression, asthma that presents emergency department with complaints of polydipsia and high sugars. Patient stated he was on medication while incarcerated however once discharged from prison, patient followed up with his primary care physician who discontinued his insulin as well as other diabetic medications. Patient's primary care physician wanted to try lifestyle modifications including diet exercise however the patient stated that he has not been exercising or watching his diet. On the day of admission, patient complained of dry mouth, generalized weakness, hiccups, and decreased appetite. Patient stated he's been unable to hold anything down for approximately one week. Patient also reported having nausea and vomiting. Patient reported midsternal chest pain and rated it as an 8/10 and sharp in character. The pain was worse with deep inhalation. Patient stated the chest pain occurs when he hiccups. Patient denied any recent ill contacts, travel, fever, chills.    Assessment & Plan  Diabetic ketoacidosis, increased anion gap metabolic acidosis  -Patient continues to need insulin drip/glucose stabilizer.   -Will continue to monitor his BMP and CBGs. -Gap is currently 14.   -Consulted diabetes coordinator for further management and education  -Hemoglobin A1c pending -Continue IVF.  -Will begin to transition off of the drip and to [place] insulin  Atypical chest pain  -Resolved, Unlikely acute coronary syndrome  -Has  been ongoing for approximately one week  -Troponins cycled and negative -TSH 1.01, Mg 2.0, phos 1.9  Hyperlipidemia -Lipid panel: Cholesterol 273, triglycerides 263, HDL 32, LDL 188 -Will start patient on statin  Depression  -Continue Seroquel and Zoloft   Peripheral neuropathy  -Continue gabapentin   GERD  -Continue PPI  Hyponatremia  -Improving, secondary to poor oral intake as well as DKA  -Will continue to monitor BMP   Hypokalemia -secondary to DKA -Will continue to replace and monitor BMP  Asthma  -Continue albuterol when necessary.   Code Status: Full  Family Communication: None at bedside  Disposition Plan: Admitted.  Time Spent in minutes   30 minutes  Procedures  None  Consults   None  DVT Prophylaxis  Lovenox  Lab Results  Component Value Date   PLT 215 10/12/2013    Medications  Scheduled Meds: . docusate sodium  100 mg Oral BID  . enoxaparin (LOVENOX) injection  40 mg Subcutaneous Q24H  . gabapentin  100 mg Oral TID  . pantoprazole (PROTONIX) IV  40 mg Intravenous Q24H  . pneumococcal 23 valent vaccine  0.5 mL Intramuscular Tomorrow-1000  . potassium chloride  10 mEq Intravenous Q1 Hr x 4  . QUEtiapine  400 mg Oral QPM  . sertraline  50  mg Oral QHS   Continuous Infusions: . sodium chloride 125 mL/hr (10/11/13 1540)  . dextrose 5 % and 0.45 % NaCl with KCl 20 mEq/L 125 mL/hr at 10/12/13 0650  . dextrose 5 % and 0.45% NaCl 125 mL/hr at 10/12/13 0223  . insulin (NOVOLIN-R) infusion 1.1 Units/hr (10/12/13 0501)   PRN Meds:.acetaminophen, acetaminophen, albuterol, alum & mag hydroxide-simeth, dextrose, morphine injection, ondansetron (ZOFRAN) IV, ondansetron  Antibiotics    Anti-infectives   None      Subjective:   [name] seen and examined today.  Patient states he's feeling better. He denies any further nausea or vomiting. He states his weakness is improving. Patient does state that he wants to is very hungry. Patient  currently denies any abdominal pain, chest pain, shortness of breath, dizziness, headache.  Objective:   Filed Vitals:   10/12/13 0700 10/12/13 0800 10/12/13 0831 10/12/13 0900  BP:  123/88    Pulse: 90 85 90 98  Temp:   97.4 F (36.3 C)   TempSrc:   Oral   Resp: Height:      Weight:      SpO2: 97% 97% 96% 98%    Wt Readings from Last 3 Encounters:  10/11/13 108.863 kg (240 lb)  05/28/12 100 kg (220 lb 7.4 oz)     Intake/Output Summary (Last 24 hours) at 10/12/13 0935 Last data filed at 10/12/13 0830  Gross per 24 hour  Intake 3250.71 ml  Output   1200 ml  Net 2050.71 ml    Exam  General: Well developed, well nourished, NAD, appears stated age  HEENT: NCAT, PERRLA, EOMI, Anicteic Sclera, mucous membranes moist.   Cardiovascular: S1 S2 auscultated, no rubs, murmurs or gallops. Regular rate and rhythm.  Respiratory: Clear to auscultation bilaterally with equal chest rise  Abdomen: Soft, nontender, mildly distended, + bowel sounds  Extremities: warm dry without cyanosis clubbing or edema  Neuro: AAOx3, cranial nerves grossly intact. Strength 5/5 in patient's upper and lower extremities bilaterally  Skin: Without rashes exudates or nodules  Psych: Normal affect and demeanor with intact judgement and insight    Data Review   Micro Results Recent Results (from the past 240 hour(s))  MRSA PCR SCREENING     Status: Abnormal   Collection Time    10/11/13  7:33 PM      Result Value Ref Range Status   MRSA by PCR POSITIVE (*) NEGATIVE Final   Comment:            The GeneXpert MRSA Assay (FDA     approved for NASAL specimens     only), is one component of a     comprehensive MRSA colonization     surveillance program. It is not     intended to diagnose MRSA     infection nor to guide or     monitor treatment for     MRSA infections.     RESULT CALLED TO, READ BACK BY AND VERIFIED WITH:     RICHARD,H RN 2112 10/11/13 MITCHELL,L    Radiology  Reports Dg Chest 2 View  10/11/2013   CLINICAL DATA:  Shortness of breath and chest pain  EXAM: CHEST  2 VIEW  COMPARISON:  05/28/2012  FINDINGS: The heart size and mediastinal contours are within normal limits. Both lungs are clear. The visualized skeletal structures are unremarkable.  IMPRESSION: No active cardiopulmonary disease.   Electronically Signed   By: Eulah Pont.D.  On: 10/11/2013 14:23    CBC  Recent Labs Lab 10/11/13 1145 10/11/13 1225 10/11/13 1833 10/12/13 0340  WBC 9.9  --  9.3 7.7  HGB 16.0 17.7* 14.9 14.0  HCT 44.3 52.0 41.0 38.5*  PLT 279  --  248 215  MCV 83.6  --  82.8 84.4  MCH 30.2  --  30.1 30.7  MCHC 36.1*  --  36.3* 36.4*  RDW 13.1  --  13.1 13.2    Chemistries   Recent Labs Lab 10/11/13 1145 10/11/13 1225 10/11/13 1833 10/11/13 2053 10/12/13 0005 10/12/13 0340 10/12/13 0814  NA 122* 120*  --  130* 133* 131* 132*  K 4.7 4.5  --  3.7 3.7 2.9* 3.7  CL 82* 94*  --  95* 96 95* 95*  CO2 14*  --   --  16* GLUCOSE 623* 665*  --  175* 152* 136* 214*  BUN 26* 29*  --  CREATININE 1.10 1.10 0.77 0.81 0.84 0.83 0.84  CALCIUM 10.4  --   --  9.5 8.8 9.6 9.0  MG  --   --  2.0  --   --   --   --   AST 18  --   --   --   --   --   --   ALT 24  --   --   --   --   --   --   ALKPHOS 97  --   --   --   --   --   --   BILITOT 0.9  --   --   --   --   --   --    ------------------------------------------------------------------------------------------------------------------ estimated creatinine clearance is 127.2 ml/min (by C-G formula based on Cr of 0.84). ------------------------------------------------------------------------------------------------------------------ No results found for this basename: HGBA1C,  in the last 72 hours ------------------------------------------------------------------------------------------------------------------  Recent Labs  10/12/13 0340  CHOL 273*  HDL 32*  LDLCALC 188*  TRIG 263*    CHOLHDL 8.5   ------------------------------------------------------------------------------------------------------------------  Recent Labs  10/11/13 1833  TSH 1.010   ------------------------------------------------------------------------------------------------------------------ No results found for this basename: VITAMINB12, FOLATE, FERRITIN, TIBC, IRON, RETICCTPCT,  in the last 72 hours  Coagulation profile No results found for this basename: INR, PROTIME,  in the last 168 hours  No results found for this basename: DDIMER,  in the last 72 hours  Cardiac Enzymes  Recent Labs Lab 10/11/13 1833 10/12/13 0005 10/12/13 0340  TROPONINI <0.30 <0.30 <0.30   ------------------------------------------------------------------------------------------------------------------ No components found with this basename: POCBNP,     [name] D.O. on 10/12/2013 at 9:35 AM  Between 7am to 7pm - Pager - [phone]  After 7pm go to [email or web address] - password [email or web address]  And look for the night coverage person covering for me after hours  [facility] Office  [phone]

## 2013-10-13 LAB — GLUCOSE, CAPILLARY
GLUCOSE-CAPILLARY: 350 mg/dL — AB (ref 70–99)
Glucose-Capillary: 291 mg/dL — ABNORMAL HIGH (ref 70–99)
Glucose-Capillary: 196 mg/dL — ABNORMAL HIGH (ref 70–99)
Glucose-Capillary: 213 mg/dL — ABNORMAL HIGH (ref 70–99)

## 2013-10-13 LAB — BASIC METABOLIC PANEL
ANION GAP: 16 — AB (ref 5–15)
BUN: 11 mg/dL (ref 6–23)
CALCIUM: 9.5 mg/dL (ref 8.4–10.5)
CO2: 20 mEq/L (ref 19–32)
Chloride: 98 mEq/L (ref 96–112)
Creatinine, Ser: 0.94 mg/dL (ref 0.50–1.35)
Glucose, Bld: 219 mg/dL — ABNORMAL HIGH (ref 70–99)
POTASSIUM: 3.6 meq/L — AB (ref 3.7–5.3)
Sodium: 134 mEq/L — ABNORMAL LOW (ref 137–147)

## 2013-10-13 LAB — CBC
HCT: 37.1 % — ABNORMAL LOW (ref 39.0–52.0)
HEMOGLOBIN: 13.1 g/dL (ref 13.0–17.0)
MCH: 30.5 pg (ref 26.0–34.0)
MCHC: 35.3 g/dL (ref 30.0–36.0)
MCV: 86.3 fL (ref 78.0–100.0)
PLATELETS: 203 10*3/uL (ref 150–400)
RBC: 4.3 MIL/uL (ref 4.22–5.81)
RDW: 13.5 % (ref 11.5–15.5)
WBC: 6.5 10*3/uL (ref 4.0–10.5)

## 2013-10-13 MED ORDER — CHLORHEXIDINE GLUCONATE CLOTH 2 % EX PADS
6.0000 | MEDICATED_PAD | Freq: Every day | CUTANEOUS | Status: DC
  Administered 2013-10-13: 6 via TOPICAL

## 2013-10-13 MED ORDER — INSULIN STARTER KIT- PEN NEEDLES (ENGLISH)
1.0000 | Freq: Once | Status: AC
  Administered 2013-10-13: 1
  Filled 2013-10-13: qty 1

## 2013-10-13 MED ORDER — LIVING WELL WITH DIABETES BOOK
Freq: Once | Status: AC
  Administered 2013-10-13: 10:00:00
  Filled 2013-10-13: qty 1

## 2013-10-13 MED ORDER — INSULIN GLARGINE 100 UNIT/ML ~~LOC~~ SOLN
20.0000 [IU] | SUBCUTANEOUS | Status: DC
  Administered 2013-10-13 – 2013-10-14 (×2): 20 [IU] via SUBCUTANEOUS
  Filled 2013-10-13 (×2): qty 0.2

## 2013-10-13 MED ORDER — POTASSIUM CHLORIDE CRYS ER 20 MEQ PO TBCR
40.0000 meq | EXTENDED_RELEASE_TABLET | Freq: Once | ORAL | Status: AC
  Administered 2013-10-13: 40 meq via ORAL
  Filled 2013-10-13: qty 2

## 2013-10-13 MED ORDER — MUPIROCIN 2 % EX OINT
1.0000 "application " | TOPICAL_OINTMENT | Freq: Two times a day (BID) | CUTANEOUS | Status: DC
  Administered 2013-10-13 – 2013-10-14 (×3): 1 via NASAL
  Filled 2013-10-13: qty 22

## 2013-10-13 NOTE — Progress Notes (Signed)
[facility]                                                                              Patient Demographics  [name], is a 53 y.o. male, DOB - 11-15-1960, UEA:[id number]  Admit date - 10/11/2013   Admitting Physician Edsel Petrin, DO  Outpatient Primary MD for the patient is Burtis Junes, MD  LOS - 2   Chief Complaint  Patient presents with  . Hyperglycemia      HPI on 10/11/2013 [name] is a 53 y.o. male with a history of diabetes mellitus, depression, asthma that presents emergency department with complaints of polydipsia and high sugars. Patient stated he was on medication while incarcerated however once discharged from prison, patient followed up with his primary care physician who discontinued his insulin as well as other diabetic medications. Patient's primary care physician wanted to try lifestyle modifications including diet exercise however the patient stated that he has not been exercising or watching his diet. On the day of admission, patient complained of dry mouth, generalized weakness, hiccups, and decreased appetite. Patient stated he's been unable to hold anything down for approximately one week. Patient also reported having nausea and vomiting. Patient reported midsternal chest pain and rated it as an 8/10 and sharp in character. The pain was worse with deep inhalation. Patient stated the chest pain occurs when he hiccups. Patient denied any recent ill contacts, travel, fever, chills.    Assessment & Plan  Diabetic ketoacidosis, increased anion gap metabolic acidosis  -Patient continues to need insulin drip/glucose stabilizer.   -Will continue to monitor his BMP and CBGs. -Consulted diabetes coordinator for further management and education and further recommendations -Hemoglobin A1c 11.2 -Will begin to transition off of the drip and to [place] insulin -Continue Lantus, Novolog resistant SS, 5 units premeal  Atypical chest pain  -Resolved,  Unlikely acute coronary syndrome  -Has been ongoing for approximately one week  -Troponins cycled and negative -TSH 1.01, Mg 2.0, phos 1.9  Hyperlipidemia -Lipid panel: Cholesterol 273, triglycerides 263, HDL 32, LDL 188 -Continue statin  Depression  -Continue Seroquel and Zoloft   Peripheral neuropathy  -Continue gabapentin   GERD  -Continue PPI  Hyponatremia  -Improving, secondary to poor oral intake as well as DKA  -Will continue to monitor BMP   Hypokalemia -secondary to DKA -Will continue to replace and monitor BMP  Asthma  -Continue albuterol when necessary.   Code Status: Full  Family Communication: Family at bedside  Disposition Plan: Admitted.  Time Spent in minutes   25 minutes  Procedures  None  Consults   None  DVT Prophylaxis  Lovenox  Lab Results  Component Value Date   PLT 203 10/13/2013    Medications  Scheduled Meds: . docusate sodium  100 mg Oral BID  . enoxaparin (LOVENOX) injection  40 mg Subcutaneous Q24H  . gabapentin  100 mg Oral TID  . insulin aspart  0-20 Units Subcutaneous TID WC  . insulin aspart  5 Units Subcutaneous TID WC  . insulin glargine  10 Units Subcutaneous Q24H  . pantoprazole  40 mg Oral Daily  . QUEtiapine  400 mg Oral QPM  . sertraline  50 mg Oral QHS  . simvastatin  20 mg Oral q1800   Continuous Infusions:   PRN Meds:.acetaminophen, acetaminophen, albuterol, alum & mag hydroxide-simeth, dextrose, morphine injection, ondansetron (ZOFRAN) IV, ondansetron  Antibiotics    Anti-infectives   None      Subjective:   [name] seen and examined today.  Patient states feels better this morning.  Denies abdominal pain, nausea or vomiting.  Has been able to tolerate his diet.  Denies chest pain, headache, dizziness, shortness of breath.  Objective:   Filed Vitals:   10/12/13 2339 10/13/13 0419 10/13/13 0735 10/13/13 0800  BP: 105/71 108/68 114/78   Pulse: 101 92 90   Temp: 97.7 F (36.5 C) 97.9 F  (36.6 C)  97.3 F (36.3 C)  TempSrc: Oral Oral  Oral  Resp: Height:      Weight:      SpO2: 95% 93% 96%     Wt Readings from Last 3 Encounters:  10/11/13 108.863 kg (240 lb)  05/28/12 100 kg (220 lb 7.4 oz)     Intake/Output Summary (Last 24 hours) at 10/13/13 1191 Last data filed at 10/13/13 0900  Gross per 24 hour  Intake   1025 ml  Output   2325 ml  Net  -1300 ml    Exam  General: Well developed, well nourished, NAD, appears stated age  HEENT: NCAT, mucous membranes moist.   Cardiovascular: S1 S2 auscultated, no rubs, murmurs or gallops. Regular rate and rhythm.  Respiratory: Clear to auscultation bilaterally with equal chest rise  Abdomen: Soft, nontender, mildly distended, + bowel sounds  Extremities: warm dry without cyanosis clubbing or edema  Neuro: AAOx3, no focal deficits  Skin: Without rashes exudates or nodules  Psych: Normal affect and demeanor with intact judgement and insight  Data Review   Micro Results Recent Results (from the past 240 hour(s))  MRSA PCR SCREENING     Status: Abnormal   Collection Time    10/11/13  7:33 PM      Result Value Ref Range Status   MRSA by PCR POSITIVE (*) NEGATIVE Final   Comment:            The GeneXpert MRSA Assay (FDA     approved for NASAL specimens     only), is one component of a     comprehensive MRSA colonization     surveillance program. It is not     intended to diagnose MRSA     infection nor to guide or     monitor treatment for     MRSA infections.     RESULT CALLED TO, READ BACK BY AND VERIFIED WITH:     RICHARD,H RN 2112 10/11/13 MITCHELL,L    Radiology Reports Dg Chest 2 View  10/11/2013   CLINICAL DATA:  Shortness of breath and chest pain  EXAM: CHEST  2 VIEW  COMPARISON:  05/28/2012  FINDINGS: The heart size and mediastinal contours are within normal limits. Both lungs are clear. The visualized skeletal structures are unremarkable.  IMPRESSION: No active cardiopulmonary  disease.   Electronically Signed   By: Alcide Clever M.D.   On: 10/11/2013 14:23    CBC  Recent Labs Lab 10/11/13 1145 10/11/13 1225 10/11/13 1833 10/12/13 0340 10/13/13 0335  WBC 9.9  --  9.3 7.7 6.5  HGB 16.0 17.7* 14.9 14.0 13.1  HCT 44.3 52.0 41.0 38.5* 37.1*  PLT 279  --  248 215 203  MCV 83.6  --  82.8 84.4 86.3  MCH 30.2  --  30.1 30.7 30.5  MCHC 36.1*  --  36.3* 36.4* 35.3  RDW 13.1  --  13.1 13.2 13.5    Chemistries   Recent Labs Lab 10/11/13 1145 10/11/13 1225 10/11/13 1833 10/11/13 2053 10/12/13 0005 10/12/13 0340 10/12/13 0814 10/13/13 0335  NA 122* 120*  --  130* 133* 131* 132* 134*  K 4.7 4.5  --  3.7 3.7 2.9* 3.7 3.6*  CL 82* 94*  --  95* 96 95* 95* 98  CO2 14*  --   --  16* GLUCOSE 623* 665*  --  175* 152* 136* 214* 219*  BUN 26* 29*  --  CREATININE 1.10 1.10 0.77 0.81 0.84 0.83 0.84 0.94  CALCIUM 10.4  --   --  9.5 8.8 9.6 9.0 9.5  MG  --   --  2.0  --   --   --   --   --   AST 18  --   --   --   --   --   --   --   ALT 24  --   --   --   --   --   --   --   ALKPHOS 97  --   --   --   --   --   --   --   BILITOT 0.9  --   --   --   --   --   --   --    ------------------------------------------------------------------------------------------------------------------ estimated creatinine clearance is 113.6 ml/min (by C-G formula based on Cr of 0.94). ------------------------------------------------------------------------------------------------------------------  Recent Labs  10/11/13 1833  HGBA1C 11.2*   ------------------------------------------------------------------------------------------------------------------  Recent Labs  10/12/13 0340  CHOL 273*  HDL 32*  LDLCALC 188*  TRIG 263*  CHOLHDL 8.5   ------------------------------------------------------------------------------------------------------------------  Recent Labs  10/11/13 1833  TSH 1.010    ------------------------------------------------------------------------------------------------------------------ No results found for this basename: VITAMINB12, FOLATE, FERRITIN, TIBC, IRON, RETICCTPCT,  in the last 72 hours  Coagulation profile No results found for this basename: INR, PROTIME,  in the last 168 hours  No results found for this basename: DDIMER,  in the last 72 hours  Cardiac Enzymes  Recent Labs Lab 10/11/13 1833 10/12/13 0005 10/12/13 0340  TROPONINI <0.30 <0.30 <0.30   ------------------------------------------------------------------------------------------------------------------ No components found with this basename: POCBNP,     [name] D.O. on 10/13/2013 at 9:22 AM  Between 7am to 7pm - Pager - [phone]  After 7pm go to [email or web address] - password [email or web address]  And look for the night coverage person covering for me after hours  [facility] Office  [phone]

## 2013-10-13 NOTE — Progress Notes (Addendum)
Inpatient Diabetes Program Recommendations  AACE/ADA: New Consensus Statement on Inpatient Glycemic Control (2013)  Target Ranges:  Prepandial:   less than 140 mg/dL      Peak postprandial:   less than 180 mg/dL (1-2 hours)      Critically ill patients:  140 - 180 mg/dL   Reason for Assessment:  Referral received.   Results for MACOY, RODWELL (MRN [id number]) as of 10/13/2013 09:36  Ref. Range 10/11/2013 18:33  Hemoglobin A1C Latest Range: <5.7 % 11.2 (H)  Results for AVERILL, PONS (MRN [id number]) as of 10/13/2013 09:36  Ref. Range 10/12/2013 17:21 10/12/2013 18:36 10/12/2013 21:59 10/13/2013 07:51  Glucose-Capillary Latest Range: 70-99 mg/dL 782 (H) 956 (H) 213 (H) 291 (H)   Diabetes history: Type 2 diabetes.   Outpatient Diabetes medications: None Current orders for Inpatient glycemic control: Lantus 10 units daily, Novolog 5 units tid with meals, Novolog resistant tid with meals.  Spoke with MD.  Note that CBG's increased after transition off insulin drip.  She states she will increase the Lantus to 20 units daily.  Patient has Medicaid and can get insulin pens to make insulin administration more convenient.  Will demonstrate use of insulin pens to patient.    Addendum:  Spoke to patient regarding diabetes and insulin.  Discussed long acting versus rapid acting insulin.  Also demonstrated use of insulin pen.  He was able to dial up dose and inject in insulin injection practice dome.  Discussed treatment and signs and symptoms of low CBG. He was able to teach back proper treatment of low CBG including glucose tablets, juice, regular soda, etc.  Also discussed monitoring with with patient and using glucose log.  RN will show patient diabetes videos this afternoon.  Will continue to follow.  Also placed order for outpatient diabetes education per protocol.

## 2013-10-13 NOTE — Progress Notes (Signed)
Utilization review completed.  

## 2013-10-14 LAB — BASIC METABOLIC PANEL
Anion gap: 11 (ref 5–15)
BUN: 10 mg/dL (ref 6–23)
CO2: 25 mEq/L (ref 19–32)
Calcium: 8.9 mg/dL (ref 8.4–10.5)
Chloride: 100 mEq/L (ref 96–112)
Creatinine, Ser: 0.93 mg/dL (ref 0.50–1.35)
Glucose, Bld: 222 mg/dL — ABNORMAL HIGH (ref 70–99)
Potassium: 3.7 mEq/L (ref 3.7–5.3)
SODIUM: 136 meq/L — AB (ref 137–147)

## 2013-10-14 LAB — GLUCOSE, CAPILLARY
Glucose-Capillary: 275 mg/dL — ABNORMAL HIGH (ref 70–99)
Glucose-Capillary: 329 mg/dL — ABNORMAL HIGH (ref 70–99)

## 2013-10-14 LAB — CBC
HEMATOCRIT: 34.1 % — AB (ref 39.0–52.0)
Hemoglobin: 12 g/dL — ABNORMAL LOW (ref 13.0–17.0)
MCH: 29.8 pg (ref 26.0–34.0)
MCHC: 35.2 g/dL (ref 30.0–36.0)
MCV: 84.6 fL (ref 78.0–100.0)
Platelets: 180 10*3/uL (ref 150–400)
RBC: 4.03 MIL/uL — ABNORMAL LOW (ref 4.22–5.81)
RDW: 13.1 % (ref 11.5–15.5)
WBC: 7 10*3/uL (ref 4.0–10.5)

## 2013-10-14 MED ORDER — BLOOD GLUCOSE METER KIT: PACK | Status: AC

## 2013-10-14 MED ORDER — INSULIN ASPART 100 UNIT/ML ~~LOC~~ SOLN: 0.0000 [IU] | Freq: Three times a day (TID) | SUBCUTANEOUS | Status: AC

## 2013-10-14 MED ORDER — INSULIN PEN NEEDLE 31G X 5 MM MISC: Status: AC

## 2013-10-14 MED ORDER — ALBUTEROL SULFATE HFA 108 (90 BASE) MCG/ACT IN AERS: 2.0000 | INHALATION_SPRAY | Freq: Four times a day (QID) | RESPIRATORY_TRACT | Status: AC | PRN

## 2013-10-14 MED ORDER — SIMVASTATIN 20 MG PO TABS: 20.0000 mg | ORAL_TABLET | Freq: Every day | ORAL | Status: AC

## 2013-10-14 MED ORDER — INSULIN ASPART 100 UNIT/ML FLEXPEN: 5.0000 [IU] | PEN_INJECTOR | Freq: Three times a day (TID) | SUBCUTANEOUS | Status: AC

## 2013-10-14 MED ORDER — INSULIN GLARGINE 100 UNIT/ML SOLOSTAR PEN: 20.0000 [IU] | PEN_INJECTOR | Freq: Every day | SUBCUTANEOUS | Status: AC

## 2013-10-14 NOTE — Discharge Instructions (Signed)
Diabetic Ketoacidosis °Diabetic ketoacidosis (DKA) is a life-threatening complication of type 1 diabetes. It must be quickly recognized and treated. Treatment requires hospitalization. °CAUSES  °When there is no insulin in the body, glucose (sugar) cannot be used, and the body breaks down fat for energy. When fat breaks down, acids (ketones) build up in the blood. Very high levels of glucose and high levels of acids lead to severe loss of body fluids (dehydration) and other dangerous chemical changes. This stresses your vital organs and can cause coma or death. °SIGNS AND SYMPTOMS  °· Tiredness (fatigue). °· Weight loss. °· Excessive thirst. °· Ketones in your urine. °· Light-headedness. °· Fruity or sweet smelling breath. °· Excessive urination. °· Visual changes. °· Confusion or irritability. °· Nausea or vomiting. °· Rapid breathing. °· Stomachache or abdominal pain. °DIAGNOSIS  °Your health care provider will diagnose DKA based on your history, physical exam, and blood tests. The health care provider will check to see if you have another illness that caused you to go into DKA. Most of this will be done quickly in an emergency room. °TREATMENT  °· Fluid replacement to correct dehydration. °· Insulin. °· Correction of electrolytes, such as potassium and sodium. °· Antibiotic medicines. °PREVENTION °· Always take your insulin. Do not skip your insulin injections. °· If you are sick, treat yourself quickly. Your body often needs more insulin to fight the illness. °· Check your blood glucose regularly. °· Check urine ketones if your blood glucose is greater than 240 milligrams per deciliter (mg/dL). °· Do not use outdated (expired) insulin. °· If your blood glucose is high, drink plenty of fluids. This helps flush out ketones. °HOME CARE INSTRUCTIONS  °· If you are sick, follow the advice of your health care provider. °· To prevent dehydration, drink enough water and fluids to keep your urine clear or pale  yellow. °¨ If you cannot eat, alternate between drinking fluids with sugar (soda, juices, flavored gelatin) and salty fluids (broth, bouillon). °¨ If you can eat, follow your usual diet and drink sugar-free liquids (water, diet drinks). °· Always take your usual dose of insulin. If you cannot eat or if your glucose is getting too low, call your health care provider for further instructions. °· Continue to monitor your blood or urine ketones every 3-4 hours around the clock. Set your alarm clock or have someone wake you up. If you are too sick, have someone test it for you. °· Rest and avoid exercise. °SEEK MEDICAL CARE IF:  °· You have a fever. °· You have ketones in your urine, or your blood glucose is higher than a level your health care provider suggests. You may need extra insulin. Call your health care provider if you need advice on adjusting your insulin. °· You cannot drink at least a tablespoon (15 mL) of fluid every 15-20 minutes. °· You have been vomiting for more than 2 hours. °· You have symptoms of DKA: °¨ Fruity smelling breath. °¨ Breathing faster or slower. °¨ Becoming very sleepy. °SEEK IMMEDIATE MEDICAL CARE IF:  °· You have signs of dehydration: °¨ Decreased urination. °¨ Increased thirst. °¨ Dry skin and mouth. °¨ Light-headedness. °· Your blood glucose is very high (as advised by your health care provider) twice in a row. °· You faint. °· You have chest pain or trouble breathing. °· You have a sudden, severe headache. °· You have sudden weakness in one arm or one leg. °· You have sudden trouble speaking or swallowing. °· You   have vomiting or diarrhea that is getting worse after 3 hours. °· You have abdominal pain. °MAKE SURE YOU:  °· Understand these instructions. °· Will watch your condition. °· Will get help right away if you are not doing well or get worse. °Document Released: 01/28/2000 Document Revised: 02/04/2013 Document Reviewed: 08/05/2008 °Exit[employer]® Patient Information ©2015 [employer]  [employer]. This information is not intended to replace advice given to you by your health care provider. Make sure you discuss any questions you have with your health care provider. ° °

## 2013-10-14 NOTE — Progress Notes (Signed)
Spoke briefly to patient.  He is ready for discharge.  He was able to verbalize how he would be taking his insulin and states that he already has appointment at [facility] for follow-up teaching.  He states he has no further questions at this time.  Thanks, Beryl Meager, RN, BC-ADM Inpatient Diabetes Coordinator Pager [phone]

## 2013-10-14 NOTE — Discharge Summary (Signed)
Physician Discharge Summary  [name] YQT:[id number] DOB: Jan 14, 1961 DOA: 10/11/2013  PCP: Jacob Junes, MD  Admit date: 10/11/2013 Discharge date: 10/14/2013  Time spent: 45 minutes  Recommendations for Outpatient Follow-up:  Patient will be discharged home. He really developed his primary care physician within one to 2 weeks of discharge. Patient was counseled on how to use his insulin. He is also told to keep a sugar log. Patient to continue his medications as prescribed. He should resume normal physical activity as tolerated. Patient should follow a heart healthy/carb modified diet.  Discharge Diagnoses:  Diabetic ketoacidosis, increased anion gap metabolic acidosis Atypical chest pain Hyperlipidemia Depression Peripheral neuropathy GERD Hyponatremia Hypokalemia Asthma  Discharge Condition: Stable  Diet recommendation: Heart healthy/carb modified  Filed Weights   10/11/13 1339  Weight: 108.863 kg (240 lb)    History of present illness:  on 10/11/2013  [name] is a 53 y.o. male with a history of diabetes mellitus, depression, asthma that presents emergency department with complaints of polydipsia and high sugars. Patient stated he was on medication while incarcerated however once discharged from prison, patient followed up with his primary care physician who discontinued his insulin as well as other diabetic medications. Patient's primary care physician wanted to try lifestyle modifications including diet exercise however the patient stated that he has not been exercising or watching his diet. On the day of admission, patient complained of dry mouth, generalized weakness, hiccups, and decreased appetite. Patient stated he's been unable to hold anything down for approximately one week. Patient also reported having nausea and vomiting. Patient reported midsternal chest pain and rated it as an 8/10 and sharp in character. The pain was worse with deep inhalation.  Patient stated the chest pain occurs when he hiccups. Patient denied any recent ill contacts, travel, fever, chills.   Hospital Course:  Diabetic ketoacidosis, increased anion gap metabolic acidosis  -Patient continues to need insulin drip/glucose stabilizer.  -Will continue to monitor his BMP and CBGs.  -Consulted diabetes coordinator for further management and education and further recommendations  -Hemoglobin A1c 11.2  -Will begin to transition off of the drip and to [place] insulin  -Continue Lantus, Novolog resistant SS, 5 units premeal  -Patient was counseled on how to use his insulin as well as told to maintain a sugar log. Patient will need to follow up with his primary care physician within one week and have repeat hemoglobin A1c in 3 months. Patient was also counseled on having regular eye exams as well as checking his feet on a regular basis.  Atypical chest pain  -Resolved, Unlikely acute coronary syndrome  -Has been ongoing for approximately one week  -Troponins cycled and negative  -TSH 1.01, Mg 2.0, phos 1.9   Hyperlipidemia  -Lipid panel: Cholesterol 273, triglycerides 263, HDL 32, LDL 188  -Continue statin   Depression  -Continue Seroquel and Zoloft   Peripheral neuropathy  -Continue gabapentin   GERD  -Continue PPI   Hyponatremia  -Resolved, secondary to poor oral intake as well as DKA   Hypokalemia  -Resolved, secondary to DKA   Asthma  -Continue albuterol when necessary.   Procedures: None  Consultations: None  Discharge Exam: Filed Vitals:   10/14/13 0813  BP: 119/79  Pulse: 86  Temp:   Resp: 23   Exam  General: Well developed, well nourished, NAD, appears stated age  HEENT: NCAT, mucous membranes moist.  Cardiovascular: S1 S2 auscultated, no rubs, murmurs or gallops. Regular rate and rhythm.  Respiratory:  Clear to auscultation bilaterally with equal chest rise  Abdomen: Soft, nontender, mildly distended, + bowel sounds  Extremities: warm  dry without cyanosis clubbing or edema  Neuro: AAOx3, no focal deficits  Psych: Normal affect and demeanor with intact judgement and insight  Discharge Instructions      Discharge Instructions   Ambulatory referral to Nutrition and Diabetic Education    Complete by:  As directed   A1C=11.2%-New to insulin-Type 2 diabetes     Discharge instructions    Complete by:  As directed   Patient will be discharged home. He really developed his primary care physician within one to 2 weeks of discharge. Patient was counseled on how to use his insulin. He is also told to keep a sugar log. Patient to continue his medications as prescribed. He should resume normal physical activity as tolerated. Patient should follow a heart healthy/carb modified diet.            Medication List         albuterol 108 (90 BASE) MCG/ACT inhaler  Commonly known as:  PROVENTIL HFA;VENTOLIN HFA  Inhale 2 puffs into the lungs every 6 (six) hours as needed for wheezing or shortness of breath.     blood glucose meter kit and supplies  Dispense based on patient and insurance preference. Use up to four times daily as directed. (FOR ICD-9 250.00, 250.01).     gabapentin 100 MG capsule  Commonly known as:  NEURONTIN  Take 100 mg by mouth 3 (three) times daily.     insulin aspart 100 UNIT/ML injection  Commonly known as:  novoLOG  Inject 0-20 Units into the skin 3 (three) times daily with meals.     insulin aspart 100 UNIT/ML FlexPen  Commonly known as:  NOVOLOG FLEXPEN  Inject 5 Units into the skin 3 (three) times daily with meals.     Insulin Glargine 100 UNIT/ML Solostar Pen  Commonly known as:  LANTUS SOLOSTAR  Inject 20 Units into the skin daily at 10 pm.     Insulin Pen Needle 31G X 5 MM Misc  Use with insulin pen.     QUEtiapine 400 MG 24 hr tablet  Commonly known as:  SEROQUEL XR  Take 400 mg by mouth every evening.     sertraline 50 MG tablet  Commonly known as:  ZOLOFT  Take 50 mg by mouth at  bedtime.     simvastatin 20 MG tablet  Commonly known as:  ZOCOR  Take 1 tablet (20 mg total) by mouth daily at 6 PM.       No Known Allergies Follow-up Information   Follow up with Jacob Palau, MD. Schedule an appointment as soon as possible for a visit in 1 week. [facility] followup)    Specialty:  Family Medicine   Contact information:   [address] [place] [phone]        The results of significant diagnostics from this hospitalization (including imaging, microbiology, ancillary and laboratory) are listed below for reference.    Significant Diagnostic Studies: Dg Chest 2 View  10/11/2013   CLINICAL DATA:  Shortness of breath and chest pain  EXAM: CHEST  2 VIEW  COMPARISON:  05/28/2012  FINDINGS: The heart size and mediastinal contours are within normal limits. Both lungs are clear. The visualized skeletal structures are unremarkable.  IMPRESSION: No active cardiopulmonary disease.   Electronically Signed   By: Inez Catalina M.D.   On: 10/11/2013 14:23  Microbiology: Recent Results (from the past 240 hour(s))  MRSA PCR SCREENING     Status: Abnormal   Collection Time    10/11/13  7:33 PM      Result Value Ref Range Status   MRSA by PCR POSITIVE (*) NEGATIVE Final   Comment:            The GeneXpert MRSA Assay (FDA     approved for NASAL specimens     only), is one component of a     comprehensive MRSA colonization     surveillance program. It is not     intended to diagnose MRSA     infection nor to guide or     monitor treatment for     MRSA infections.     RESULT CALLED TO, READ BACK BY AND VERIFIED WITH:     RICHARD,H RN [id number] 10/11/13 MITCHELL,L     Labs: Basic Metabolic Panel:  Recent Labs Lab 10/11/13 1225 10/11/13 1833  10/12/13 0005 10/12/13 0340 10/12/13 0867 10/13/13 0335 10/14/13 0242  NA 120*  --   < > 133* 131* 132* 134* 136*  K 4.5  --   < > 3.7 2.9* 3.7 3.6* 3.7  CL 94*  --   < > 96 95* 95* 98  100  CO2  --   --   < > _0 GLUCOSE 665*  --   < > 152* 136* 214* 219* 222*  BUN 29*  --   < > _1 CREATININE 1.10 0.77  < > 0.84 0.83 0.84 0.94 0.93  CALCIUM  --   --   < > 8.8 9.6 9.0 9.5 8.9  MG  --  2.0  --   --   --   --   --   --   PHOS  --  1.9*  --   --   --   --   --   --   < > = values in this interval not displayed. Liver Function Tests:  Recent Labs Lab 10/11/13 1145  AST 18  ALT 24  ALKPHOS 97  BILITOT 0.9  PROT 7.7  ALBUMIN 3.9   No results found for this basename: LIPASE, AMYLASE,  in the last 168 hours No results found for this basename: AMMONIA,  in the last 168 hours CBC:  Recent Labs Lab 10/11/13 1145 10/11/13 1225 10/11/13 1833 10/12/13 0340 10/13/13 0335 10/14/13 0242  WBC 9.9  --  9.3 7.7 6.5 7.0  HGB 16.0 17.7* 14.9 14.0 13.1 12.0*  HCT 44.3 52.0 41.0 38.5* 37.1* 34.1*  MCV 83.6  --  82.8 84.4 86.3 84.6  PLT 279  --  248 215 203 180   Cardiac Enzymes:  Recent Labs Lab 10/11/13 1158 10/11/13 1833 10/12/13 0005 10/12/13 0340  TROPONINI <0.30 <0.30 <0.30 <0.30   BNP: BNP (last 3 results) No results found for this basename: PROBNP,  in the last 8760 hours CBG:  Recent Labs Lab 10/13/13 0751 10/13/13 1102 10/13/13 1614 10/13/13 2155 10/14/13 0815  GLUCAP 291* 350* 196* 213* 275*       Signed:  Blondina Coderre  [facility] 10/14/2013, 10:31 AM

## 2013-10-14 NOTE — Progress Notes (Addendum)
Received orders to D/C pt. D/C education given to pt. Pt verbalizes understanding of instructions. VS stable.  1415: pt D/C'd to home after eating lunch per pt request. No s/s of acute distress noted.

## 2013-10-15 ENCOUNTER — Encounter: Payer: Medicaid Other | Attending: Family Medicine | Admitting: *Deleted

## 2013-10-15 VITALS — Ht 70.0 in | Wt 242.0 lb

## 2013-10-15 DIAGNOSIS — Z713 Dietary counseling and surveillance: Secondary | ICD-10-CM | POA: Diagnosis not present

## 2013-10-15 DIAGNOSIS — E119 Type 2 diabetes mellitus without complications: Secondary | ICD-10-CM | POA: Diagnosis present

## 2013-10-15 NOTE — Progress Notes (Signed)
Appt start time: 1530 end time:  1700.  Assessment:  Patient was seen on  10/15/13 for individual diabetes education. Has been recently incarcerated and on Insulin. He is also on disability but his payments have not started. His sister will be helping him out obtaining his medications and groceries. He has been using his Lantus but has been without his Meal coverage insulin for an undetermined period of time. He is testing his glucose. Most recent readings of FBS 101, 2hpp breakfast 268, before lunch 375, 2hpp lunch /dl.  Patient Education Plan per assessed needs and concerns is to attend individual session for Diabetes Self Management Education.  Current HbA1c: 11.2%  Preferred Learning Style:   Auditory  Visual  Hands on  No preference indicated   Learning Readiness:   Change in progress  MEDICATIONS: Lantus, Novolog  DIETARY INTAKE:  B ( AM): salmon/crackers, eggs,  Snk ( AM): none  L ( PM): [place] club sandwich Snk ( PM): nuts, trail mix D ( PM): salmon, baked potato, brocolli Snk ( PM): fruit Beverages: water,diet gingerAle, Milk,  Usual physical activity: none at this time    Intervention:  Nutrition counseling provided.  Discussed diabetes disease process and treatment options.  Discussed physiology of diabetes and role of obesity on insulin resistance.  Encouraged moderate weight reduction to improve glucose levels.  Discussed role of medications and diet in glucose control  Provided education on macronutrients on glucose levels.  Provided education on carb counting, importance of regularly scheduled meals/snacks, and meal planning  Discussed effects of physical activity on glucose levels and long-term glucose control.  Recommended 150 minutes of physical activity/week.  Reviewed patient medications.  Discussed role of medication on blood glucose and possible side effects  Discussed blood glucose monitoring and interpretation.  Discussed recommended target  ranges and individual ranges.    Described short-term complications: hyper- and hypo-glycemia.  Discussed causes,symptoms, and treatment options.  Discussed prevention, detection, and treatment of long-term complications.  Discussed the role of prolonged elevated glucose levels on body systems.  Discussed role of stress on blood glucose levels and discussed strategies to manage psychosocial issues.  Discussed recommendations for long-term diabetes self-care.  Established checklist for medical, dental, and emotional self-care.  Teaching Method Utilized:  Visual Auditory Hands on  Handouts given during visit include: Living Well with Diabetes Carb Counting and Food Label handouts Meal Plan Card My plate W0J conversion  Barriers to learning/adherence to lifestyle change: getting started  Diabetes self-care support plan:   [facility] support group  family  Demonstrated degree of understanding via:  Teach Back   Monitoring/Evaluation:  Dietary intake, exercise, test glucose, and body weight in 1 month(s).

## 2013-10-15 NOTE — Patient Instructions (Addendum)
Plan:  Aim for 3-4 Carb Choices per meal (45-60 grams) +/- 1 either way  Aim for 0-15 Carbs per snack if hungry  Include protein in moderation with your meals and snacks Consider reading food labels for Total Carbohydrate and Fat Grams of foods Consider  increasing your activity level by walking for 30 minutes daily as tolerated Consider checking BG at alternate times per day as directed by MD  Consider taking medication as directed by MD  [name] Reduced calorie bread    [name] protein Bars  BE CAREFUL OF YOUR ICE CREAM

## 2013-10-16 NOTE — ED Provider Notes (Signed)
Medical screening examination/treatment/procedure(s) were conducted as a shared visit with non-physician practitioner(s) and myself.  I personally evaluated the patient during the encounter.   EKG Interpretation   Date/Time:  Saturday October 11 2013 12:53:46 EDT Ventricular Rate:  94 PR Interval:  141 QRS Duration: 84 QT Interval:  356 QTC Calculation: 445 R Axis:   -29 Text Interpretation:  Sinus rhythm Borderline left axis deviation ST elev,  probable normal early repol pattern ED PHYSICIAN INTERPRETATION AVAILABLE  IN [facility] Confirmed by TEST, Record ([id number]) on 10/13/2013 7:50:17  AM         EKG Interpretation   Date/Time:  Saturday October 11 2013 12:53:46 EDT Ventricular Rate:  94 PR Interval:  141 QRS Duration: 84 QT Interval:  356 QTC Calculation: 445 R Axis:   -29 Text Interpretation:  Sinus rhythm Borderline left axis deviation ST elev,  probable normal early repol pattern ED PHYSICIAN INTERPRETATION AVAILABLE  IN [facility] Confirmed by TEST, Record ([id number]) on 10/13/2013 7:50:17  AM        Rolland Porter, MD 10/16/13 2141

## 2013-10-21 ENCOUNTER — Encounter: Payer: Self-pay | Admitting: *Deleted

## 2013-11-25 ENCOUNTER — Ambulatory Visit: Payer: Medicaid Other | Admitting: *Deleted
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
## 2015-07-21 IMAGING — CR DG CHEST 2V
2 series · 2 of 2 positions shown · non-contrast
Comparison: 05/28/2012

CLINICAL DATA: Shortness of breath and chest pain

EXAM:
CHEST  2 VIEW

[w chest pa]
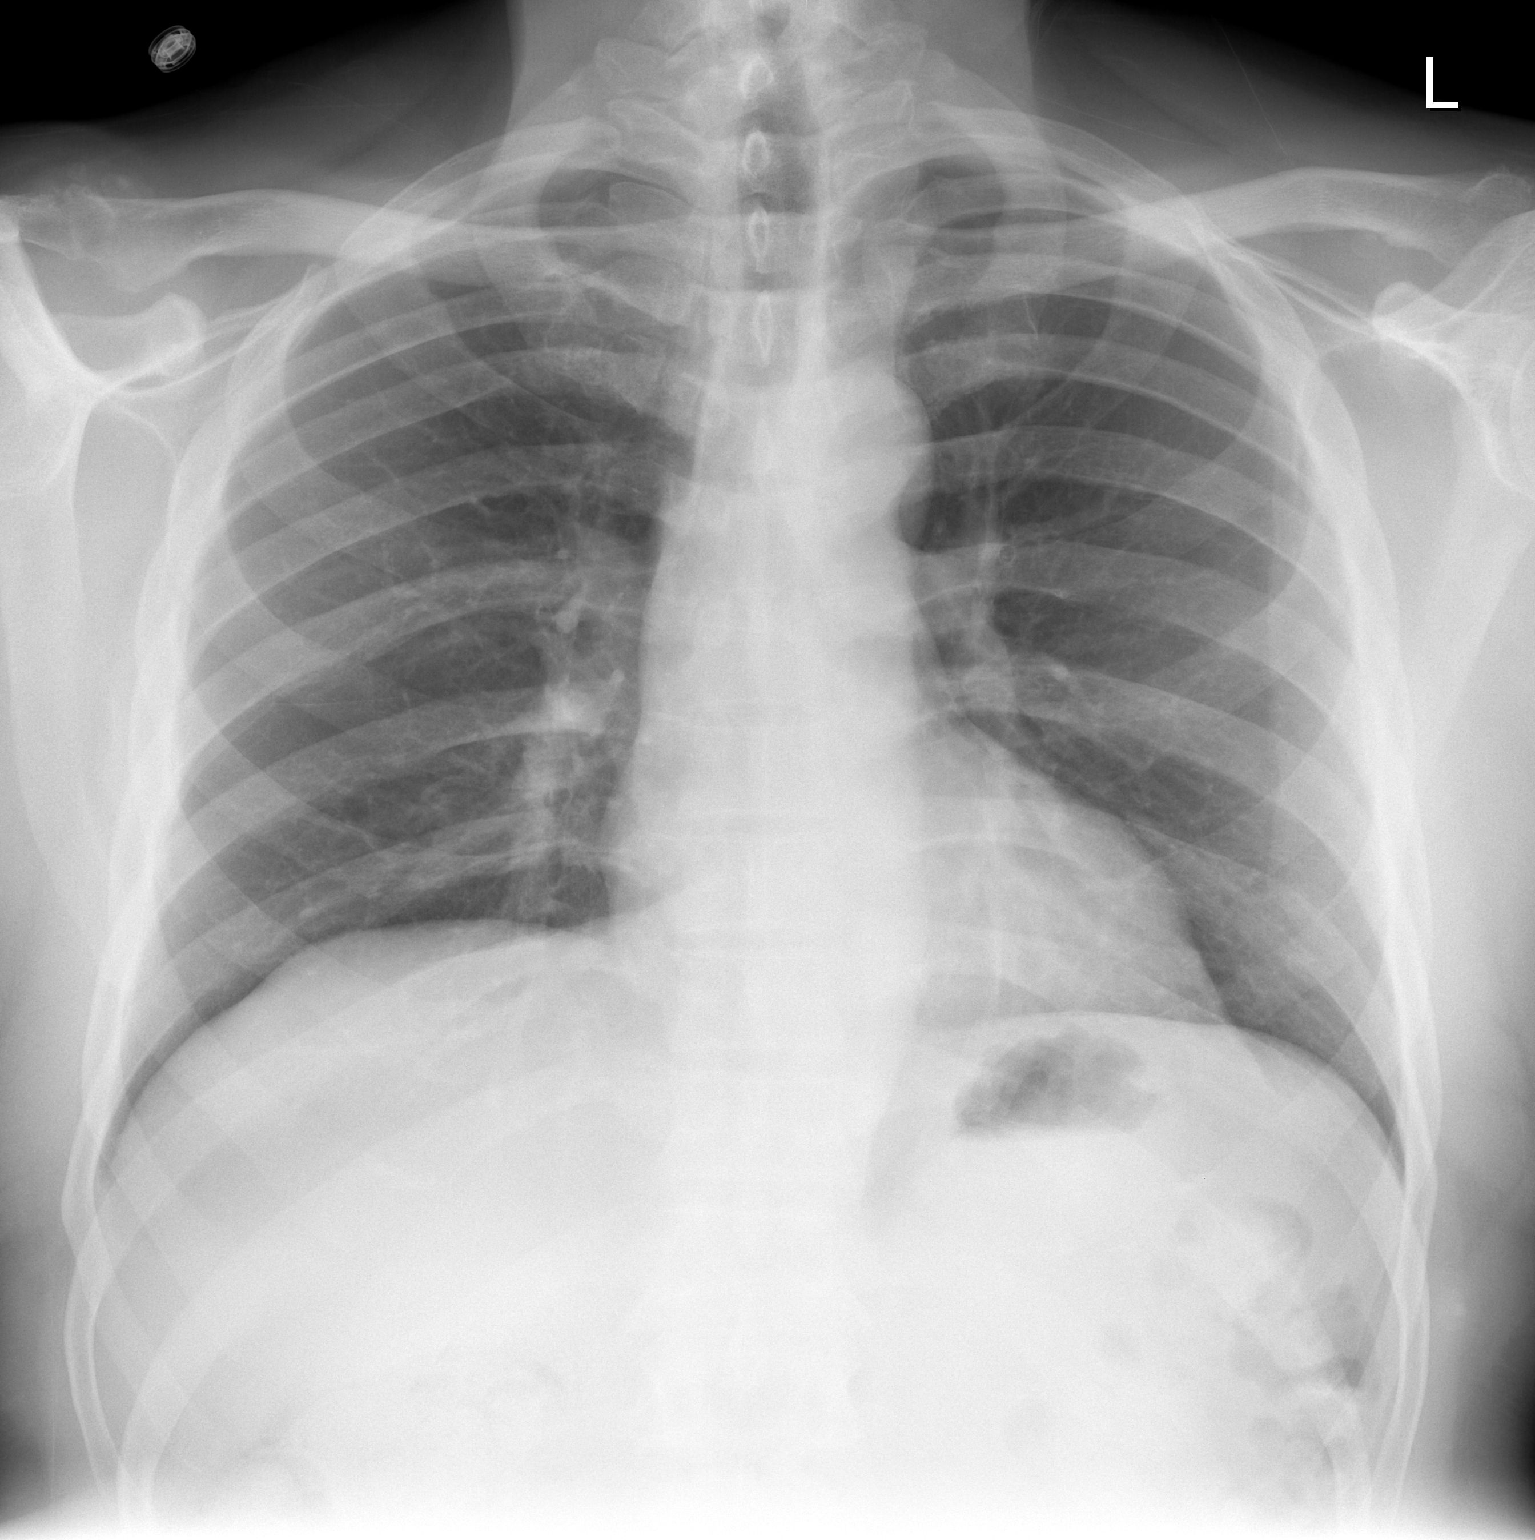

[w chest lat *]
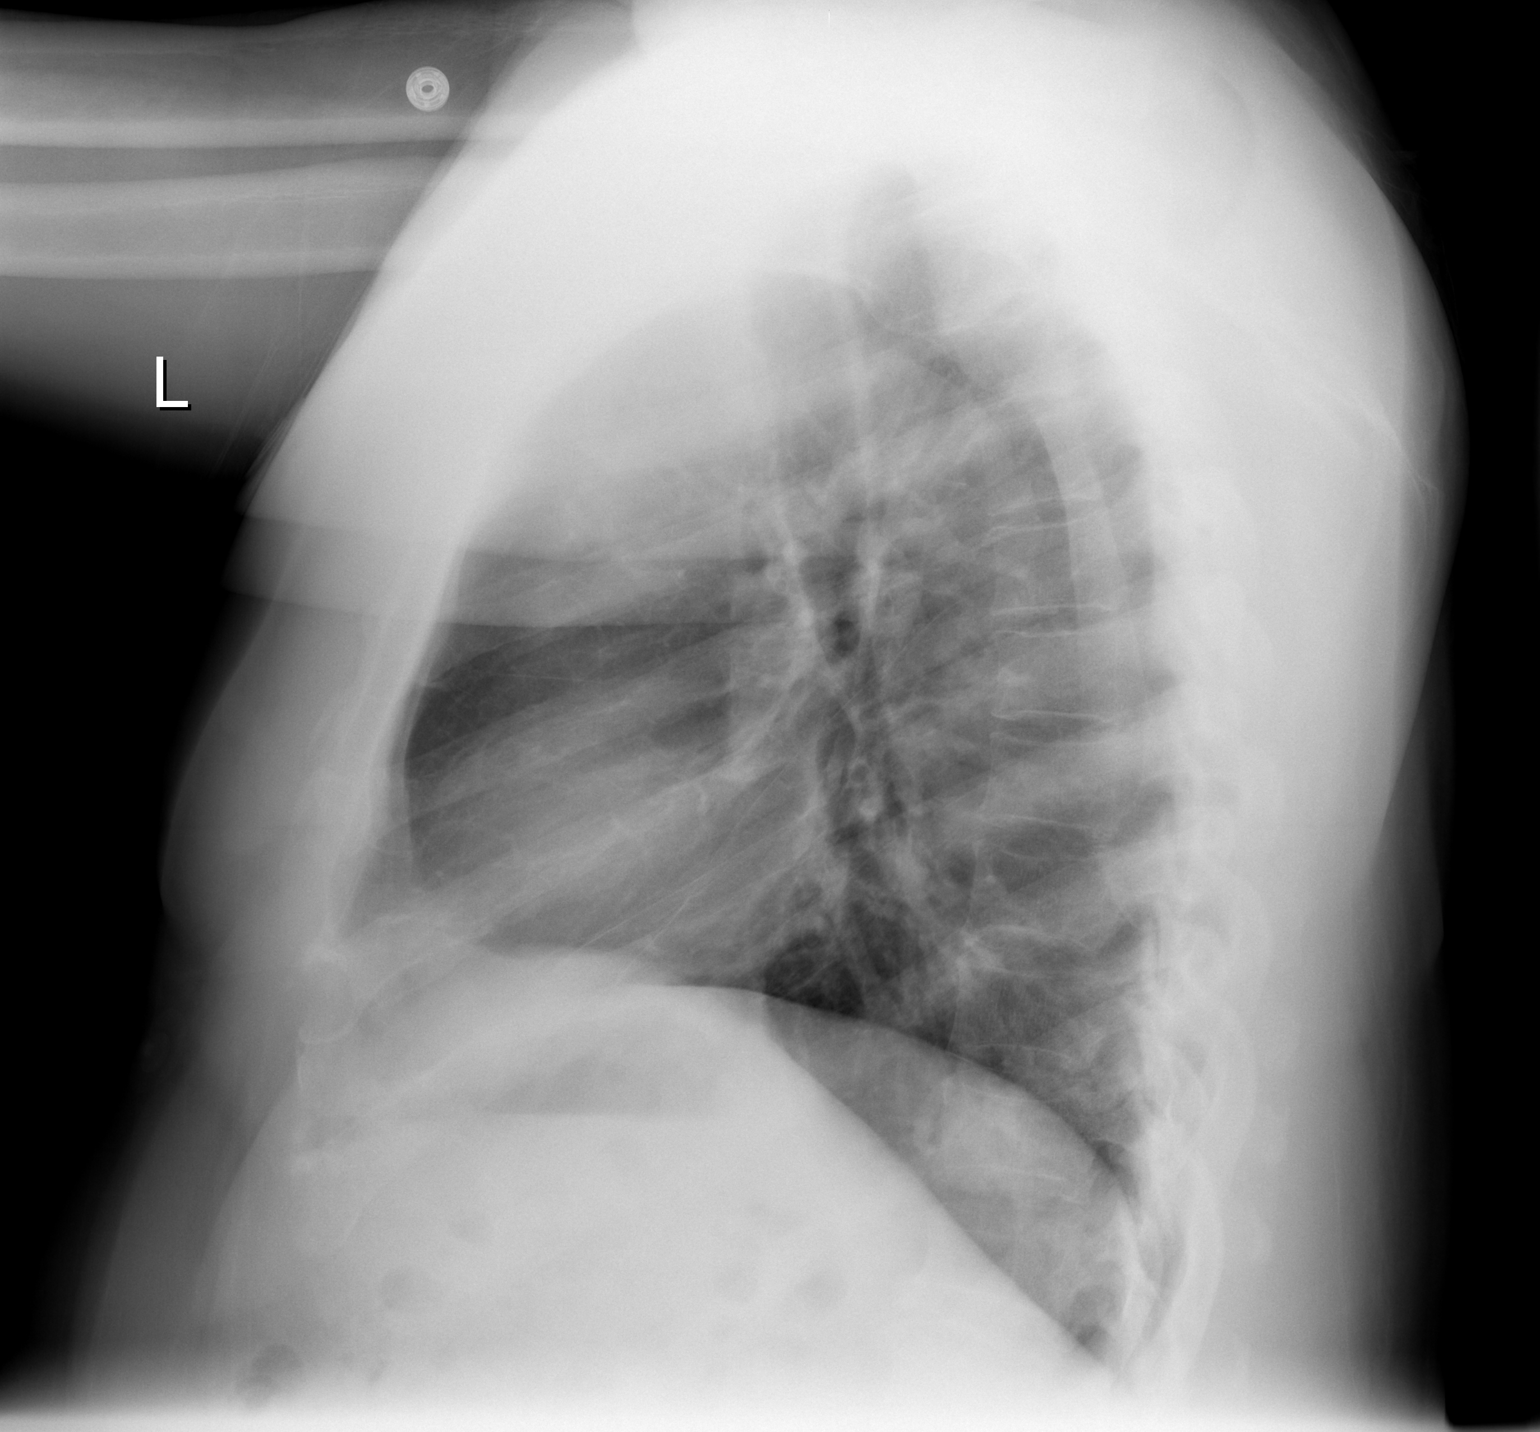

[2 of 2 positions shown; findings below may reference images not displayed]

FINDINGS: The heart size and mediastinal contours are within normal limits.
Both lungs are clear. The visualized skeletal structures are
unremarkable.
IMPRESSION: No active cardiopulmonary disease.
